# Patient Record
Sex: Male | Born: 1945 | Race: Black or African American | Hispanic: No | Smoking: Never smoker
Health system: Southern US, Community
[De-identification: ages and names within clinical notes are randomized; demographics above are authoritative.]

## PROBLEM LIST (undated history)

## (undated) DIAGNOSIS — I10 Essential (primary) hypertension: Secondary | ICD-10-CM

## (undated) DIAGNOSIS — D649 Anemia, unspecified: Secondary | ICD-10-CM

## (undated) HISTORY — PX: HEMORRHOID SURGERY: SHX153

---

## 2005-05-30 ENCOUNTER — Other Ambulatory Visit: Payer: Self-pay

## 2005-05-30 ENCOUNTER — Emergency Department: Payer: Self-pay | Admitting: Internal Medicine

## 2005-06-06 ENCOUNTER — Ambulatory Visit: Payer: Self-pay

## 2012-09-05 ENCOUNTER — Emergency Department: Payer: Self-pay | Admitting: Emergency Medicine

## 2016-09-24 ENCOUNTER — Encounter: Payer: Self-pay | Admitting: Emergency Medicine

## 2016-09-24 ENCOUNTER — Emergency Department
Admission: EM | Admit: 2016-09-24 | Discharge: 2016-09-24 | Disposition: A | Discharge status: Home / Self Care | Payer: Medicare Other | Attending: Emergency Medicine | Admitting: Emergency Medicine

## 2016-09-24 DIAGNOSIS — R42 Dizziness and giddiness: Secondary | ICD-10-CM | POA: Diagnosis present

## 2016-09-24 DIAGNOSIS — R030 Elevated blood-pressure reading, without diagnosis of hypertension: Secondary | ICD-10-CM | POA: Diagnosis not present

## 2016-09-24 DIAGNOSIS — Z79899 Other long term (current) drug therapy: Secondary | ICD-10-CM | POA: Insufficient documentation

## 2016-09-24 HISTORY — DX: Anemia, unspecified: D64.9

## 2016-09-24 LAB — BASIC METABOLIC PANEL
ANION GAP: 6 (ref 5–15)
BUN: 11 mg/dL (ref 6–20)
CHLORIDE: 104 mmol/L (ref 101–111)
CO2: 28 mmol/L (ref 22–32)
CREATININE: 1.03 mg/dL (ref 0.61–1.24)
Calcium: 9.3 mg/dL (ref 8.9–10.3)
GFR calc non Af Amer: >60 mL/min (ref >60)
Glucose, Bld: 113 mg/dL — ABNORMAL HIGH (ref 65–99)
POTASSIUM: 3.8 mmol/L (ref 3.5–5.1)
SODIUM: 138 mmol/L (ref 135–145)

## 2016-09-24 LAB — CBC
HEMATOCRIT: 39.7 % — AB (ref 40.0–52.0)
HEMOGLOBIN: 13 g/dL (ref 13.0–18.0)
MCH: 28.4 pg (ref 26.0–34.0)
MCHC: 32.8 g/dL (ref 32.0–36.0)
MCV: 86.6 fL (ref 80.0–100.0)
PLATELETS: 263 10*3/uL (ref 150–440)
RBC: 4.58 MIL/uL (ref 4.40–5.90)
RDW: 14.3 % (ref 11.5–14.5)
WBC: 4.3 10*3/uL (ref 3.8–10.6)

## 2016-09-24 MED ORDER — HYDROCHLOROTHIAZIDE 25 MG PO TABS: 25.0000 mg | ORAL_TABLET | Freq: Every day | ORAL | 1 refills | Status: DC

## 2016-09-24 NOTE — ED Provider Notes (Addendum)
[facility] Emergency Department Provider Note        Time seen: ----------------------------------------- 2:18 PM on 09/24/2016 -----------------------------------------    I have reviewed the triage vital signs and the nursing notes.   HISTORY  Chief Complaint Hypertension and Dizziness    HPI Earl HollowFred W Luth Jr. is a 70 y.o. male who presents to the ER for elevated blood pressure. Patient states he hasn't taken his blood pressure but he feels like he might be up. He reports he felt dizzy a few days ago but denies dizziness now. Patient denies taking medication for blood pressure. He is not sure when last time it was checked. He denies fevers, chills, chest pain, shortness of breath, vomiting or diarrhea.   Past Medical History:  Diagnosis Date  . Anemia     There are no active problems to display for this patient.   Past Surgical History:  Procedure Laterality Date  . HEMORRHOID SURGERY      Allergies Review of patient's allergies indicates no known allergies.  Social History Social History  Substance Use Topics  . Smoking status: Never Smoker  . Smokeless tobacco: Never Used  . Alcohol use No    Review of Systems Constitutional: Negative for fever. Cardiovascular: Negative for chest pain. Respiratory: Negative for shortness of breath. Gastrointestinal: Negative for abdominal pain, vomiting and diarrhea. Genitourinary: Negative for dysuria. Musculoskeletal: Negative for back pain. Skin: Negative for rash. Neurological: Negative for headaches, focal weakness or numbness.  10-point ROS otherwise negative.  ____________________________________________   PHYSICAL EXAM:  VITAL SIGNS: ED Triage Vitals [09/24/16 1105]  Enc Vitals Group     BP (!) 213/88     Pulse Rate 78     Resp 18     Temp (!) 96.9 F (36.1 C)     Temp Source Oral     SpO2 100 %     Weight 130 lb (59 kg)     Height 6\' 2"  (1.88 m)     Head Circumference       Peak Flow      Pain Score      Pain Loc      Pain Edu?      Excl. in GC?     Constitutional: Alert and oriented. Well appearing and in no distress. Eyes: Conjunctivae are normal. PERRL. Normal extraocular movements. ENT   Head: Normocephalic and atraumatic.   Nose: No congestion/rhinnorhea.   Mouth/Throat: Mucous membranes are moist.   Neck: No stridor. Cardiovascular: Normal rate, regular rhythm. No murmurs, rubs, or gallops. Respiratory: Normal respiratory effort without tachypnea nor retractions. Breath sounds are clear and equal bilaterally. No wheezes/rales/rhonchi. Gastrointestinal: Soft and nontender. Normal bowel sounds Musculoskeletal: Nontender with normal range of motion in all extremities. No lower extremity tenderness nor edema. Neurologic:  Normal speech and language. No gross focal neurologic deficits are appreciated.  Skin:  Skin is warm, dry and intact. No rash noted. Psychiatric: Mood and affect are normal. Speech and behavior are normal.  ____________________________________________  EKG: Interpreted by me. Normal sinus rhythm rate of 70 bpm, normal PR interval, normal QRS, normal QT.  ____________________________________________  ED COURSE:  Pertinent labs & imaging results that were available during my care of the patient were reviewed by me and considered in my medical decision making (see chart for details). Clinical Course  Patient is in no acute distress, we will assess with basic labs and reevaluate.  Procedures ____________________________________________   LABS (pertinent positives/negatives)  Labs Reviewed  BASIC  METABOLIC PANEL - Abnormal; Notable for the following:       Result Value   Glucose, Bld 113 (*)    All other components within normal limits  CBC - Abnormal; Notable for the following:    HCT 39.7 (*)    All other components within normal limits  URINALYSIS COMPLETEWITH MICROSCOPIC ([facility] ONLY)    ____________________________________________  FINAL ASSESSMENT AND PLAN  Elevated blood pressure  Plan: Patient with labs as dictated above. Patient presents the ER in no distress. His labs are grossly unremarkable, his blood pressures improved without any treatment. There may be an anxiety component. He is stable for outpatient follow-up with his doctor.   Emily FilbertWilliams, Ayonna Speranza E, MD   Note: This dictation was prepared with Dragon dictation. Any transcriptional errors that result from this process are unintentional    Emily FilbertJonathan E Chyann Ambrocio, MD 09/24/16 1538    Emily FilbertJonathan E Atreyu Mak, MD 09/24/16 [phone]

## 2016-09-24 NOTE — ED Triage Notes (Signed)
Pt presents to ED with reports of elevated blood pressure. Pt states he hasn't taken his BP but he feels like it might be up. Pt reports he felt dizzy a few days ago but denies dizziness now. Pt denies taking medication for blood pressure.

## 2016-09-24 NOTE — ED Notes (Signed)
Pt stating that he went to the "walk-in clinic" because he felt like he could pass out. Pt denying any palpitations, HA , SOB, or CP . The clinic sent him here for evaluation because his BP was elevated at this clinic. Pt's BP has decreased since arrival.

## 2016-09-24 NOTE — ED Notes (Signed)
Dr. Mayford KnifeWilliams made aware of pt's BP . Dr. Mayford KnifeWilliams has given pt BP medication and pt understands the need to follow up with his doctor.

## 2017-08-17 ENCOUNTER — Encounter: Payer: Self-pay | Admitting: Emergency Medicine

## 2017-08-17 ENCOUNTER — Observation Stay
Admission: EM | Admit: 2017-08-17 | Discharge: 2017-08-19 | Disposition: A | Discharge status: Home / Self Care | Payer: Medicare Other | Attending: Internal Medicine | Admitting: Internal Medicine

## 2017-08-17 ENCOUNTER — Emergency Department: Payer: Medicare Other

## 2017-08-17 DIAGNOSIS — R270 Ataxia, unspecified: Secondary | ICD-10-CM | POA: Diagnosis present

## 2017-08-17 DIAGNOSIS — Z7982 Long term (current) use of aspirin: Secondary | ICD-10-CM | POA: Insufficient documentation

## 2017-08-17 DIAGNOSIS — Z23 Encounter for immunization: Secondary | ICD-10-CM | POA: Insufficient documentation

## 2017-08-17 DIAGNOSIS — R42 Dizziness and giddiness: Secondary | ICD-10-CM

## 2017-08-17 DIAGNOSIS — E43 Unspecified severe protein-calorie malnutrition: Secondary | ICD-10-CM | POA: Diagnosis not present

## 2017-08-17 DIAGNOSIS — E860 Dehydration: Secondary | ICD-10-CM

## 2017-08-17 DIAGNOSIS — I340 Nonrheumatic mitral (valve) insufficiency: Secondary | ICD-10-CM | POA: Insufficient documentation

## 2017-08-17 DIAGNOSIS — I10 Essential (primary) hypertension: Secondary | ICD-10-CM | POA: Insufficient documentation

## 2017-08-17 DIAGNOSIS — E876 Hypokalemia: Secondary | ICD-10-CM | POA: Insufficient documentation

## 2017-08-17 DIAGNOSIS — D649 Anemia, unspecified: Secondary | ICD-10-CM | POA: Insufficient documentation

## 2017-08-17 DIAGNOSIS — Z79899 Other long term (current) drug therapy: Secondary | ICD-10-CM | POA: Diagnosis not present

## 2017-08-17 DIAGNOSIS — Z681 Body mass index (BMI) 19 or less, adult: Secondary | ICD-10-CM | POA: Diagnosis not present

## 2017-08-17 HISTORY — DX: Essential (primary) hypertension: I10

## 2017-08-17 LAB — BASIC METABOLIC PANEL
ANION GAP: 13 (ref 5–15)
BUN: 12 mg/dL (ref 6–20)
CALCIUM: 9.7 mg/dL (ref 8.9–10.3)
CO2: 25 mmol/L (ref 22–32)
Chloride: 100 mmol/L — ABNORMAL LOW (ref 101–111)
Creatinine, Ser: 1.27 mg/dL — ABNORMAL HIGH (ref 0.61–1.24)
GFR, EST NON AFRICAN AMERICAN: 55 mL/min — AB (ref >60)
GLUCOSE: 158 mg/dL — AB (ref 65–99)
POTASSIUM: 3 mmol/L — AB (ref 3.5–5.1)
Sodium: 138 mmol/L (ref 135–145)

## 2017-08-17 LAB — CBC
HEMATOCRIT: 34.4 % — AB (ref 40.0–52.0)
HEMOGLOBIN: 11.9 g/dL — AB (ref 13.0–18.0)
MCH: 29.2 pg (ref 26.0–34.0)
MCHC: 34.4 g/dL (ref 32.0–36.0)
MCV: 84.9 fL (ref 80.0–100.0)
Platelets: 304 10*3/uL (ref 150–440)
RBC: 4.06 MIL/uL — AB (ref 4.40–5.90)
RDW: 14.6 % — ABNORMAL HIGH (ref 11.5–14.5)
WBC: 7.2 10*3/uL (ref 3.8–10.6)

## 2017-08-17 LAB — URINALYSIS, COMPLETE (UACMP) WITH MICROSCOPIC
BACTERIA UA: NONE SEEN
BILIRUBIN URINE: NEGATIVE
Glucose, UA: NEGATIVE mg/dL
KETONES UR: 5 mg/dL — AB
LEUKOCYTES UA: NEGATIVE
NITRITE: NEGATIVE
Protein, ur: NEGATIVE mg/dL
SPECIFIC GRAVITY, URINE: 1.009 (ref 1.005–1.030)
Squamous Epithelial / LPF: NONE SEEN
pH: 7 (ref 5.0–8.0)

## 2017-08-17 LAB — TROPONIN I

## 2017-08-17 MED ORDER — MECLIZINE HCL 25 MG PO TABS
ORAL_TABLET | ORAL | Status: AC
  Filled 2017-08-17: qty 2

## 2017-08-17 MED ORDER — POTASSIUM CHLORIDE 20 MEQ PO PACK
40.0000 meq | PACK | Freq: Once | ORAL | Status: AC
  Administered 2017-08-17: 40 meq via ORAL
  Filled 2017-08-17: qty 2

## 2017-08-17 MED ORDER — ACETAMINOPHEN 325 MG PO TABS: 650.0000 mg | ORAL_TABLET | ORAL | Status: DC | PRN

## 2017-08-17 MED ORDER — ACETAMINOPHEN 650 MG RE SUPP: 650.0000 mg | RECTAL | Status: DC | PRN

## 2017-08-17 MED ORDER — LISINOPRIL 10 MG PO TABS: 10.0000 mg | ORAL_TABLET | Freq: Every day | ORAL | 0 refills | Status: DC

## 2017-08-17 MED ORDER — AMLODIPINE BESYLATE 5 MG PO TABS: 2.5000 mg | ORAL_TABLET | Freq: Every day | ORAL | 0 refills | Status: DC

## 2017-08-17 MED ORDER — MECLIZINE HCL 25 MG PO TABS
50.0000 mg | ORAL_TABLET | Freq: Once | ORAL | Status: AC
  Administered 2017-08-17: 50 mg via ORAL

## 2017-08-17 MED ORDER — ASPIRIN 81 MG PO CHEW
324.0000 mg | CHEWABLE_TABLET | Freq: Once | ORAL | Status: AC
  Administered 2017-08-17: 324 mg via ORAL
  Filled 2017-08-17: qty 4

## 2017-08-17 MED ORDER — SODIUM CHLORIDE 0.9 % IV BOLUS (SEPSIS)
1000.0000 mL | Freq: Once | INTRAVENOUS | Status: AC
  Administered 2017-08-17: 1000 mL via INTRAVENOUS

## 2017-08-17 MED ORDER — PNEUMOCOCCAL VAC POLYVALENT 25 MCG/0.5ML IJ INJ
0.5000 mL | INJECTION | INTRAMUSCULAR | Status: AC
  Administered 2017-08-18: 17:00:00 0.5 mL via INTRAMUSCULAR
  Filled 2017-08-17: qty 0.5

## 2017-08-17 MED ORDER — CEFTRIAXONE SODIUM IN DEXTROSE 20 MG/ML IV SOLN: 1.0000 g | Freq: Once | INTRAVENOUS | Status: DC

## 2017-08-17 MED ORDER — ENOXAPARIN SODIUM 40 MG/0.4ML ~~LOC~~ SOLN
40.0000 mg | SUBCUTANEOUS | Status: DC
  Administered 2017-08-17 – 2017-08-18 (×2): 40 mg via SUBCUTANEOUS
  Filled 2017-08-17 (×2): qty 0.4

## 2017-08-17 MED ORDER — MECLIZINE HCL 25 MG PO TABS: 25.0000 mg | ORAL_TABLET | Freq: Three times a day (TID) | ORAL | 1 refills | Status: DC | PRN

## 2017-08-17 MED ORDER — ONDANSETRON HCL 4 MG/2ML IJ SOLN
4.0000 mg | Freq: Once | INTRAMUSCULAR | Status: AC
  Administered 2017-08-17: 4 mg via INTRAVENOUS
  Filled 2017-08-17: qty 2

## 2017-08-17 MED ORDER — STROKE: EARLY STAGES OF RECOVERY BOOK
Freq: Once | Status: AC
  Administered 2017-08-17: 22:00:00

## 2017-08-17 MED ORDER — AZITHROMYCIN 500 MG PO TABS: 500.0000 mg | ORAL_TABLET | Freq: Once | ORAL | Status: DC

## 2017-08-17 MED ORDER — ACETAMINOPHEN 160 MG/5ML PO SOLN
650.0000 mg | ORAL | Status: DC | PRN
  Filled 2017-08-17: qty 20.3

## 2017-08-17 NOTE — ED Provider Notes (Addendum)
[facility] Emergency Department Provider Note  ____________________________________________  Time seen: Approximately 4:04 PM  I have reviewed the triage vital signs and the nursing notes.   HISTORY  Chief Complaint Dizziness    HPI Earl Jenkins. is a 71 y.o. male comes to the ED complaining of dizziness that started this morning. He characterizes the dizziness as room spinning and not lightheadedness. It is worse when he opens his eyes or turns his head. It's worse when he changes position. No alleviating factors. He also feels like his hearing is muffled in his right ear.symptoms are intermittent, moderate intensity and present. No falls or trauma  He has a history of hypertension, and had been off medication for a period of time. He was just restarted on antihypertensives 4 days ago. He was started on a combination lisinopril hydrochlorothiazide medication.he notes that while he's been taking these medications, he's been urinating very frequently and feeling more thirsty. No vomiting or diarrhea. No chest pain abdominal pain back pain shortness of breath. No headaches or paresthesias or weakness. Denies dysuria     Past Medical History:  Diagnosis Date  . Anemia   . Hypertension      There are no active problems to display for this patient.    Past Surgical History:  Procedure Laterality Date  . HEMORRHOID SURGERY       Prior to Admission medications   Medication Sig Start Date End Date Taking? Authorizing Provider  vitamin B-12 (CYANOCOBALAMIN) 1000 MCG tablet Take 1,000 mcg by mouth daily.   Yes [provider]  amLODipine (NORVASC) 5 MG tablet Take 0.5 tablets (2.5 mg total) by mouth daily. 08/17/17   Sharman Cheek, MD  lisinopril (PRINIVIL,ZESTRIL) 10 MG tablet Take 1 tablet (10 mg total) by mouth daily. 08/17/17 08/17/18  Sharman Cheek, MD  meclizine (ANTIVERT) 25 MG tablet Take 1 tablet (25 mg total) by mouth 3 (three)  times daily as needed for dizziness or nausea. 08/17/17   Sharman Cheek, MD     Allergies Patient has no known allergies.   No family history on file.  Social History Social History  Substance Use Topics  . Smoking status: Never Smoker  . Smokeless tobacco: Never Used  . Alcohol use No    Review of Systems  Constitutional:   No fever or chills.  ENT:   No sore throat. No rhinorrhea. Cardiovascular:   No chest pain or syncope. Respiratory:   No dyspnea or cough. Gastrointestinal:   Negative for abdominal pain, vomiting and diarrhea.  Musculoskeletal:   Negative for focal pain or swelling All other systems reviewed and are negative except as documented above in ROS and HPI.  ____________________________________________   PHYSICAL EXAM:  VITAL SIGNS: ED Triage Vitals  Enc Vitals Group     BP 08/17/17 1300 (!) 143/72     Pulse Rate 08/17/17 1300 (!) 59     Resp 08/17/17 1300 16     Temp 08/17/17 1300 97.8 F (36.6 C)     Temp Source 08/17/17 1300 Oral     SpO2 08/17/17 1300 100 %     Weight 08/17/17 1258 130 lb (59 kg)     Height 08/17/17 1258  (1.88 m)     Head Circumference --      Peak Flow --      Pain Score --      Pain Loc --      Pain Edu? --      Excl.  in GC? --     Vital signs reviewed, nursing assessments reviewed.   Constitutional:   Alert and oriented. Well appearing and in no distress. Eyes:   No scleral icterus.  EOMI. No nystagmus. No conjunctival pallor. PERRL. ENT   Head:   Normocephalic and atraumatic.external canal and TMs normal bilaterally   Nose:   No congestion/rhinnorhea.    Mouth/Throat:   MMM, no pharyngeal erythema. No peritonsillar mass.    Neck:   No meningismus. Full ROM Hematological/Lymphatic/Immunilogical:   No cervical lymphadenopathy. Cardiovascular:   RRR. Symmetric bilateral radial and DP pulses.  No murmurs.  Respiratory:   Normal respiratory effort without tachypnea/retractions. Breath sounds are  clear and equal bilaterally. No wheezes/rales/rhonchi. Gastrointestinal:   Soft and nontender. Non distended. There is no CVA tenderness.  No rebound, rigidity, or guarding. Genitourinary:   deferred Musculoskeletal:   Normal range of motion in all extremities. No joint effusions.  No lower extremity tenderness.  No edema. Neurologic:   Normal speech and language.  Motor grossly intact. cerebellar function intact No gross focal neurologic deficits are appreciated.  Skin:    Skin is warm, dry and intact. No rash noted.  No petechiae, purpura, or bullae.  ____________________________________________    LABS (pertinent positives/negatives) (all labs ordered are listed, but only abnormal results are displayed) Labs Reviewed  BASIC METABOLIC PANEL - Abnormal; Notable for the following:       Result Value   Potassium 3.0 (*)    Chloride 100 (*)    Glucose, Bld 158 (*)    Creatinine, Ser 1.27 (*)    GFR calc non Af Amer 55 (*)    All other components within normal limits  CBC - Abnormal; Notable for the following:    RBC 4.06 (*)    Hemoglobin 11.9 (*)    HCT 34.4 (*)    RDW 14.6 (*)    All other components within normal limits  URINALYSIS, COMPLETE (UACMP) WITH MICROSCOPIC - Abnormal; Notable for the following:    Color, Urine STRAW (*)    APPearance CLEAR (*)    Hgb urine dipstick SMALL (*)    Ketones, ur 5 (*)    All other components within normal limits  TROPONIN I  CBG MONITORING, ED   ____________________________________________   EKG  interpreted by me Sinus rhythm rate of 58, normal axis and intervals. Normal QRS ST segments and T waves. Extensive artifact and baseline wander limits interpretation, but overall it is a normal-appearing EKG.  ____________________________________________    RADIOLOGY  Ct Head Wo Contrast  Result Date: 08/17/2017 CLINICAL DATA:  Dizziness EXAM: CT HEAD WITHOUT CONTRAST TECHNIQUE: Contiguous axial images were obtained from the base  of the skull through the vertex without intravenous contrast. COMPARISON:  None. FINDINGS: Brain: No evidence of acute infarction, hemorrhage, hydrocephalus, extra-axial collection or mass lesion/mass effect. Vascular: No hyperdense vessel or unexpected calcification. Skull: Normal. Negative for fracture or focal lesion. Sinuses/Orbits: No acute finding. Other: None. IMPRESSION: No acute abnormality noted. Electronically Signed   By: Alcide Clever M.D.   On: 08/17/2017 14:53    ____________________________________________   PROCEDURES Procedures  ____________________________________________   INITIAL IMPRESSION / ASSESSMENT AND PLAN / ED COURSE  Pertinent labs & imaging results that were available during my care of the patient were reviewed by me and considered in my medical decision making (see chart for details).    Clinical Course as of Aug 17 2030  Wynelle Link Aug 17, 2017  1526 P/w vertigo, likely  precipitated by dehydration and hypokalemia secondary to initiation of HCTZ.  Will give IVF, K repletion, after which I expect patient to feel much better.  Pt advised to DC his combo lisinopril/hctz pill.  I will rx lisinopril and amlodipine in equivalent dosing.  [PS]  1742 Still dizzy/ataxic on standing. Will give repeat NS bolus , meclizine, reassess  [PS]  1943 Pt now feels back to normal, "like my own self." ambulatory with steady gait. No pronator drift. Normal finger-nose. Vitals unremarkable. Will DC home. Presentation not c/w acs, pe, dissection, stroke.  [PS]  2029 Now dizzy and ataxic again. Possible vertebrobasilar insufficiency or posterior stroke. We'll plan to hospitalize for further stroke workup. Currently, NIH stroke scale is 1, patient is not a candidate for acute intervention such as endovascular or TPA.  [PS]    Clinical Course User Index [PS] Sharman Cheek, MD     ____________________________________________   FINAL CLINICAL IMPRESSION(S) / ED DIAGNOSES  Final  diagnoses:  Vertigo  Dehydration  Ataxia      New Prescriptions   AMLODIPINE (NORVASC) 5 MG TABLET    Take 0.5 tablets (2.5 mg total) by mouth daily.   LISINOPRIL (PRINIVIL,ZESTRIL) 10 MG TABLET    Take 1 tablet (10 mg total) by mouth daily.   MECLIZINE (ANTIVERT) 25 MG TABLET    Take 1 tablet (25 mg total) by mouth 3 (three) times daily as needed for dizziness or nausea.     Portions of this note were generated with dragon dictation software. Dictation errors may occur despite best attempts at proofreading.    Sharman Cheek, MD 08/17/17 1946    Sharman Cheek, MD 08/17/17 2033

## 2017-08-17 NOTE — ED Notes (Signed)
Ambulated patient in room.  Patient c/o felling dizzy when standing and ambulating.  Gait slightly unsteady.

## 2017-08-17 NOTE — ED Notes (Signed)
Dr Scotty Court at bedside to address pt concerns at this time.

## 2017-08-17 NOTE — ED Notes (Signed)
Pt called out. Informed this RN that he wanted the MD to be aware that he "was feeling dizzy again". Dr Scotty Court to be made aware.

## 2017-08-17 NOTE — ED Notes (Signed)
Patient denies pain and is resting comfortably.  

## 2017-08-17 NOTE — ED Triage Notes (Signed)
Arrives via [facility] with c/o dizziness.  Symptoms started this morning.  C/O dizziness when opening eyes, moving head, standing up.  States just restarted on BP pills this week on Wednesday.

## 2017-08-17 NOTE — H&P (Signed)
[facility] [place] [facility]   PATIENT NAME: Earl Jenkins    MR#:  [id number]  DATE OF BIRTH:  [birth year]  DATE OF ADMISSION:  08/17/2017  PRIMARY CARE PHYSICIAN: Dorothey Baseman, MD   REQUESTING/REFERRING PHYSICIAN: Scotty Court, MD  CHIEF COMPLAINT:   Chief Complaint  Patient presents with  . Dizziness    HISTORY OF PRESENT ILLNESS:  Earl Jenkins  is a 71 y.o. male who presents with acute onset room spinning sensation and ataxia starting suddenly around 9 AM this morning. When his symptoms persisted he came to the ED for evaluation. His initial workup here was within normal limits. He recently started taking HCTZ for BP control, and initially it was felt this could be contributing to his symptoms. After fluid administration he initially felt some better, but when he got up to walk symptoms returned. Hospitalists were called for admission.  PAST MEDICAL HISTORY:   Past Medical History:  Diagnosis Date  . Anemia   . Hypertension     PAST SURGICAL HISTORY:   Past Surgical History:  Procedure Laterality Date  . HEMORRHOID SURGERY      SOCIAL HISTORY:   Social History  Substance Use Topics  . Smoking status: Never Smoker  . Smokeless tobacco: Never Used  . Alcohol use No    FAMILY HISTORY:   Family History  Problem Relation Age of Onset  . Hypertension Other     DRUG ALLERGIES:  No Known Allergies  MEDICATIONS AT HOME:   Prior to Admission medications   Medication Sig Start Date End Date Taking? Authorizing Provider  vitamin B-12 (CYANOCOBALAMIN) 1000 MCG tablet Take 1,000 mcg by mouth daily.   Yes [provider]  amLODipine (NORVASC) 5 MG tablet Take 0.5 tablets (2.5 mg total) by mouth daily. 08/17/17   Sharman Cheek, MD  lisinopril (PRINIVIL,ZESTRIL) 10 MG tablet Take 1 tablet (10 mg total) by mouth daily. 08/17/17 08/17/18  Sharman Cheek, MD  meclizine (ANTIVERT) 25 MG tablet Take 1 tablet (25 mg total) by  mouth 3 (three) times daily as needed for dizziness or nausea. 08/17/17   Sharman Cheek, MD    REVIEW OF SYSTEMS:  Review of Systems  Constitutional: Negative for chills, fever, malaise/fatigue and weight loss.  HENT: Negative for ear pain, hearing loss and tinnitus.   Eyes: Negative for blurred vision, double vision, pain and redness.  Respiratory: Negative for cough, hemoptysis and shortness of breath.   Cardiovascular: Negative for chest pain, palpitations, orthopnea and leg swelling.  Gastrointestinal: Negative for abdominal pain, constipation, diarrhea, nausea and vomiting.  Genitourinary: Negative for dysuria, frequency and hematuria.  Musculoskeletal: Negative for back pain, joint pain and neck pain.  Skin:       No acne, rash, or lesions  Neurological: Positive for dizziness. Negative for tremors, focal weakness and weakness.  Endo/Heme/Allergies: Negative for polydipsia. Does not bruise/bleed easily.  Psychiatric/Behavioral: Negative for depression. The patient is not nervous/anxious and does not have insomnia.      VITAL SIGNS:   Vitals:   08/17/17 1258 08/17/17 1300 08/17/17 1913 08/17/17 2038  BP:  (!) 143/72 (!) 153/77 (!) 159/72  Pulse:  (!) 59 68 67  Resp:  Temp:  97.8 F (36.6 C)    TempSrc:  Oral    SpO2:  100% 100% 100%  Weight: 59 kg (130 lb)     Height:  (1.88 m)      Wt Readings from Last 3 Encounters:  08/17/17 59 kg (130 lb)  09/24/16 59 kg (130 lb)    PHYSICAL EXAMINATION:  Physical Exam  Vitals reviewed. Constitutional: He is oriented to person, place, and time. He appears well-developed and well-nourished. No distress.  HENT:  Head: Normocephalic and atraumatic.  Mouth/Throat: Oropharynx is clear and moist.  Eyes: Pupils are equal, round, and reactive to light. Conjunctivae and EOM are normal. No scleral icterus.  Neck: Normal range of motion. Neck supple. No JVD present. No thyromegaly present.  Cardiovascular: Normal rate,  regular rhythm and intact distal pulses.  Exam reveals no gallop and no friction rub.   No murmur heard. Respiratory: Effort normal and breath sounds normal. No respiratory distress. He has no wheezes. He has no rales.  GI: Soft. Bowel sounds are normal. He exhibits no distension. There is no tenderness.  Musculoskeletal: Normal range of motion. He exhibits no edema.  No arthritis, no gout  Lymphadenopathy:    He has no cervical adenopathy.  Neurological: He is alert and oriented to person, place, and time. No cranial nerve deficit.  Neurologic: Cranial nerves II-XII intact, Sensation intact to light touch/pinprick, 5/5 strength in all extremities, no dysarthria, no aphasia, no dysphagia, memory intact, finger to nose testing showed mild pastpointing L>R hand and mild double tapping on rapid alternating movements L>R, no pronator drift   Skin: Skin is warm and dry. No rash noted. No erythema.  Psychiatric: He has a normal mood and affect. His behavior is normal. Judgment and thought content normal.    LABORATORY PANEL:   CBC  Recent Labs Lab 08/17/17 1316  WBC 7.2  HGB 11.9*  HCT 34.4*  PLT 304   ------------------------------------------------------------------------------------------------------------------  Chemistries   Recent Labs Lab 08/17/17 1316  NA 138  K 3.0*  CL 100*  CO2 25  GLUCOSE 158*  BUN 12  CREATININE 1.27*  CALCIUM 9.7   ------------------------------------------------------------------------------------------------------------------  Cardiac Enzymes  Recent Labs Lab 08/17/17 1316  TROPONINI <0.03   ------------------------------------------------------------------------------------------------------------------  RADIOLOGY:  Ct Head Wo Contrast  Result Date: 08/17/2017 CLINICAL DATA:  Dizziness EXAM: CT HEAD WITHOUT CONTRAST TECHNIQUE: Contiguous axial images were obtained from the base of the skull through the vertex without intravenous  contrast. COMPARISON:  None. FINDINGS: Brain: No evidence of acute infarction, hemorrhage, hydrocephalus, extra-axial collection or mass lesion/mass effect. Vascular: No hyperdense vessel or unexpected calcification. Skull: Normal. Negative for fracture or focal lesion. Sinuses/Orbits: No acute finding. Other: None. IMPRESSION: No acute abnormality noted. Electronically Signed   By: Alcide Clever M.D.   On: 08/17/2017 14:53    EKG:   Orders placed or performed during the hospital encounter of 08/17/17  . ED EKG  . ED EKG  . EKG 12-Lead  . EKG 12-Lead    IMPRESSION AND PLAN:  Principal Problem:   Ataxia - admitted with orders per stroke admission orders set, including corresponding labs, imaging, and consults including neurology consult Active Problems:   HTN (hypertension) - holding home antihypertensives right now, a) until stroke workup is complete, and b) given suspicion that if he didn't have a stroke his symptoms may be a side effect from his new antihypertensives.   Dizziness - workup as above, when necessary meclizine  All the records are reviewed and case discussed with ED provider. Management plans discussed with the patient and/or family.  DVT PROPHYLAXIS: SubQ lovenox  GI PROPHYLAXIS: None  ADMISSION STATUS: Observation  CODE STATUS: Full Code Status History    This patient does not have a recorded code status.  Please follow your organizational policy for patients in this situation.      TOTAL TIME TAKING CARE OF THIS PATIENT: 40 minutes.   Earl Jenkins 08/17/2017, 8:44 PM  [facility]  Office  [phone]  CC: Primary care physician; Dorothey Baseman, MD  Note:  This document was prepared using Dragon voice recognition software and may include unintentional dictation errors.

## 2017-08-18 ENCOUNTER — Observation Stay (HOSPITAL_BASED_OUTPATIENT_CLINIC_OR_DEPARTMENT_OTHER)
Admit: 2017-08-18 | Discharge: 2017-08-18 | Disposition: A | Discharge status: Home / Self Care | Payer: Medicare Other | Attending: Internal Medicine | Admitting: Internal Medicine

## 2017-08-18 ENCOUNTER — Observation Stay: Payer: Medicare Other

## 2017-08-18 DIAGNOSIS — I340 Nonrheumatic mitral (valve) insufficiency: Secondary | ICD-10-CM

## 2017-08-18 DIAGNOSIS — R42 Dizziness and giddiness: Secondary | ICD-10-CM

## 2017-08-18 DIAGNOSIS — R270 Ataxia, unspecified: Secondary | ICD-10-CM

## 2017-08-18 LAB — LIPID PANEL
CHOLESTEROL: 173 mg/dL (ref 0–200)
HDL: 77 mg/dL (ref >40)
LDL CALC: 87 mg/dL (ref 0–99)
TRIGLYCERIDES: 43 mg/dL (ref <150)
Total CHOL/HDL Ratio: 2.2 RATIO
VLDL: 9 mg/dL (ref 0–40)

## 2017-08-18 LAB — HEMOGLOBIN A1C
HEMOGLOBIN A1C: 5.3 % (ref 4.8–5.6)
Mean Plasma Glucose: 105.41 mg/dL

## 2017-08-18 LAB — POTASSIUM: Potassium: 3.8 mmol/L (ref 3.5–5.1)

## 2017-08-18 LAB — ECHOCARDIOGRAM COMPLETE
Height: 74 in
Weight: 2059.2 oz

## 2017-08-18 LAB — VITAMIN B12: Vitamin B-12: 2298 pg/mL — ABNORMAL HIGH (ref 180–914)

## 2017-08-18 MED ORDER — ADULT MULTIVITAMIN W/MINERALS CH
1.0000 | ORAL_TABLET | Freq: Every day | ORAL | Status: DC
  Administered 2017-08-19: 1 via ORAL
  Filled 2017-08-18: qty 1

## 2017-08-18 MED ORDER — ENSURE ENLIVE PO LIQD
237.0000 mL | Freq: Three times a day (TID) | ORAL | Status: DC
  Administered 2017-08-18 – 2017-08-19 (×2): 237 mL via ORAL

## 2017-08-18 MED ORDER — BENAZEPRIL HCL 5 MG PO TABS
5.0000 mg | ORAL_TABLET | Freq: Every day | ORAL | Status: DC
  Filled 2017-08-18: qty 1

## 2017-08-18 MED ORDER — GADOBENATE DIMEGLUMINE 529 MG/ML IV SOLN
10.0000 mL | Freq: Once | INTRAVENOUS | Status: AC | PRN
  Administered 2017-08-18: 10 mL via INTRAVENOUS

## 2017-08-18 MED ORDER — VITAMIN B-12 1000 MCG PO TABS
1000.0000 ug | ORAL_TABLET | Freq: Every day | ORAL | Status: DC
  Administered 2017-08-18 – 2017-08-19 (×2): 1000 ug via ORAL
  Filled 2017-08-18 (×2): qty 1

## 2017-08-18 MED ORDER — BENAZEPRIL HCL 5 MG PO TABS
5.0000 mg | ORAL_TABLET | Freq: Every day | ORAL | Status: DC
  Administered 2017-08-19: 5 mg via ORAL
  Filled 2017-08-18: qty 1

## 2017-08-18 NOTE — Care Management Obs Status (Signed)
MEDICARE OBSERVATION STATUS NOTIFICATION   Patient Details  Name: Earl Jenkins. MRN: [id number] Date of Birth: 1946/08/15   Medicare Observation Status Notification Given:  Yes Notice given and explained to patient and wife.  The patient and wife declined to sign.   One given to patient and the other to HIM for scanning   Eber Hong, RN 08/18/2017, 11:24 AM

## 2017-08-18 NOTE — Progress Notes (Signed)
Physical Therapy Evaluation Patient Details Name: Earl Jenkins. MRN: [id number] DOB: August 02, 1946 Today's Date: 08/18/2017   History of Present Illness  Pt admitted for ataxia due to c/o dizziness. PMH of HTN.   Clinical Impression  Pt is a pleasant 71 year old male who was admitted for ataxia. Pt performs bed mobility independently, transfers with RW with mod. I, and amb with RW with CGA. Pt reports feeling dizzy when rolling to R side and sitting up, however dizziness subsides after a few minutes. Pt amb one lap around nurse's station with RW and CGA, would occasionally lean arm against mine for stability during turns. Pt amb a second lap around nurse's station without RW and appeared able to maintain balance throughout with CGA. Pt appeared safe to amb without RW, however is slightly unsteady on feet when making turns, recommend use of RW at home. During amb pt able to visually fixate on objects in hallway, no noted dizzness or LOB. Monitored BP throughout, no orthostasis. Performed vestibular tests, pt demonstrates deficits with balance during amb and vestibular-ocular function, noted R eye nystagmus at baseline and direction-changing nystagmus. Would benefit from skilled PT to address above deficits and promote optimal return to home. Recommend transition to outpatient PT with vestibular focus upon DC from acute hospitalization. This entire session was guided, instructed, and directly supervised by Cephus Slater, DPT.    Follow Up Recommendations Outpatient PT    Equipment Recommendations  Rolling walker with 5" wheels    Recommendations for Other Services       Precautions / Restrictions Precautions Precautions: Fall Restrictions Weight Bearing Restrictions: No      Mobility  Bed Mobility Overal bed mobility: Independent Bed Mobility: Supine to Sit     Supine to sit: Independent     General bed mobility comments: Pt able to rise from supine to sit at EOB with proper  technique, no cues, no use of bed rails. Pt reported feeling slightly dizzy, subsided after a few min. Pt able to roll to both sides, reported increased dizziness when rolling to R.   Transfers Overall transfer level: Modified independent Equipment used: Rolling walker (2 wheeled)             General transfer comment: Pt able to rise from seated EOB to standing with proper technique, no cues needed. Pt reported feeling dizzy upon standing, but felt less dizzy after a few min.   Ambulation/Gait Ambulation/Gait assistance: Min guard Ambulation Distance (Feet): 390 Feet Assistive device: Rolling walker (2 wheeled) Gait Pattern/deviations: Step-through pattern     General Gait Details: Pt amb with reciprocal alternating gait pattern. Pt able to look at objects in hallway and room numbers while amb to increase challenge. Pt able to maintain balance during straight paths, however with turns appeared slightly unsteady on feet. Pt amb one lap with RW, and one lap without RW, no LOB/SOB/dizziness.     Stairs            Wheelchair Mobility    Modified Rankin (Stroke Patients Only)       Balance Overall balance assessment: Needs assistance Sitting-balance support: Feet supported Sitting balance-Leahy Scale: Good Sitting balance - Comments: Pt able to safely sit at EOB independenlty, able to maintain position while measuring BP   Standing balance support: Bilateral upper extremity supported Standing balance-Leahy Scale: Good Standing balance comment: Pt able to stand at EOB with RW and look side to side several times. Pt did not appear to need RW however  used it for safety since pt reported feeling dizzy upon standing. Maintained position with no LOB while measuring BP.                              Pertinent Vitals/Pain Pain Assessment: No/denies pain    Home Living Family/patient expects to be discharged to:: Private residence Living Arrangements:  Non-relatives/Friends Available Help at Discharge: Friend(s) Type of Home: House Home Access: Stairs to enter Entrance Stairs-Rails: Can reach both;Right;Left Entrance Stairs-Number of Steps: 3 in front entrance, 2 in rear entrance Home Layout: One level Home Equipment: None      Prior Function Level of Independence: Independent         Comments: Pt able to perform all ADLs independently, mow the lawn, ride his bicycle     Hand Dominance        Extremity/Trunk Assessment   Upper Extremity Assessment Upper Extremity Assessment: Generalized weakness (UE MMT grossly 4/5)    Lower Extremity Assessment Lower Extremity Assessment: Generalized weakness (LE MMT grossly 4/5)    Cervical / Trunk Assessment Cervical / Trunk Assessment: Normal  Communication   Communication: No difficulties  Cognition Arousal/Alertness: Awake/alert Behavior During Therapy: WFL for tasks assessed/performed Overall Cognitive Status: Within Functional Limits for tasks assessed                                        General Comments      Exercises Other Exercises Other Exercises: Supine ther-ex 10x, B ankle pumps, B SLRs. Pt able to perform with proper technique, no cues needed.  Other Exercises: Supination/pronation RAMPs 5x and heel-to-knee RAMPS 5x both sides, pt able to maintain quick tempo and coordination. Finger-to-nose, pt able to maintain tempo, increased challenge by changing location of finger, pt able to perform with one second delay with new finger location. Other Exercises: RAMPs heel-to-knee 5x, able to maintain tempo Other Exercises: Vestibular: Pt presents with slight nystagmus of R eye at baseline, positive for direction-changing nystagmus and upbeating nystagmus. Able to perform visual tracking, saccadic eye movements, VOR relfex intact.    Assessment/Plan    PT Assessment Patient needs continued PT services  PT Problem List Decreased activity  tolerance;Decreased mobility;Decreased coordination;Decreased knowledge of use of DME       PT Treatment Interventions DME instruction;Gait training;Therapeutic activities;Therapeutic exercise;Balance training;Other (comment) (vestibular treatment)    PT Goals (Current goals can be found in the Care Plan section)  Acute Rehab PT Goals Patient Stated Goal: To return home and decrease dizziness PT Goal Formulation: With patient Time For Goal Achievement: 09/01/17 Potential to Achieve Goals: Good    Frequency Min 2X/week   Barriers to discharge        Co-evaluation               AM-PAC PT "6 Clicks" Daily Activity  Outcome Measure Difficulty turning over in bed (including adjusting bedclothes, sheets and blankets)?: None Difficulty moving from lying on back to sitting on the side of the bed? : None Difficulty sitting down on and standing up from a chair with arms (e.g., wheelchair, bedside commode, etc,.)?: None Help needed moving to and from a bed to chair (including a wheelchair)?: None Help needed walking in hospital room?: None Help needed climbing 3-5 steps with a railing? : A Little 6 Click Score: 23    End of Session  Equipment Utilized During Treatment: Gait belt Activity Tolerance: Patient tolerated treatment well Patient left: in chair;with call bell/phone within reach;with chair alarm set;with family/visitor present   PT Visit Diagnosis: Other symptoms and signs involving the nervous system (R29.898);Unsteadiness on feet (R26.81);Other abnormalities of gait and mobility (R26.89)    Time: 2956-2130 PT Time Calculation (min) (ACUTE ONLY): 42 min   Charges:         PT G Codes:   PT G-Codes **NOT FOR INPATIENT CLASS** Functional Assessment Tool Used: AM-PAC 6 Clicks Basic Mobility Functional Limitation: Mobility: Walking and moving around Mobility: Walking and Moving Around Current Status (Q6578): At least 1 percent but less than 20 percent impaired, limited  or restricted Mobility: Walking and Moving Around Goal Status [phone]): 0 percent impaired, limited or restricted    Renford Dills, SPT  Renford Dills 08/18/2017, 11:55 AM

## 2017-08-18 NOTE — Progress Notes (Signed)
SLP Cancellation Note  Patient Details Name: Earl Jenkins. MRN: [id number] DOB: 06/08/1946   Cancelled treatment:       Reason Eval/Treat Not Completed: SLP screened, no needs identified, will sign off (chart reviewed; consulted NSG then met w/ pt/family). Pt denied any difficulty swallowing and is currently on a regular diet; tolerates swallowing pills w/ water per NSG. He does not have his dentures w/ him and is choosing softer, broken down foods w/ family and Dietitian currently. Pt conversed at conversational level w/out deficits noted; pt and family denied any speech-language deficits.  No further skilled ST services indicated as pt appears at his baseline. Pt agreed. NSG to reconsult if any change in status.    Orinda Kenner, MS, CCC-SLP Rainelle Sulewski 08/18/2017, 11:36 AM

## 2017-08-18 NOTE — Progress Notes (Signed)
*  PRELIMINARY RESULTS* Echocardiogram 2D Echocardiogram has been performed.  Earl Jenkins 08/18/2017, 1:31 PM

## 2017-08-18 NOTE — Care Management (Signed)
Placed in observation for dizziness and ataxia.Independent in all adls, denies issues accessing medical care, obtaining medications or with transportation.  Current with PCP.  Has access to a walker if needed. Work up for cva thus far is negative

## 2017-08-18 NOTE — Care Management (Signed)
Presents from home with dizziness and ataxia and work up in progress to rule out CVA.  PT and neuro consults pending.  obtained order for set of orthostatic vital signs

## 2017-08-18 NOTE — Progress Notes (Signed)
Patient in bed, not in any form of distress. Verbalized a decreased in being dizzy. NIH remains zero, neuro check every 2 hours done and will continue till 10:00. Kept safe and comfortable. Needs attended.

## 2017-08-18 NOTE — Progress Notes (Addendum)
[facility] - [place] at [facility]                                                                                                                                                                                  Patient Demographics   Earl Jenkins, is a 71 y.o. male, DOB - 1946-04-03, YNW:[id number]  Admit date - 08/17/2017   Admitting Physician [name], MD  Outpatient Primary MD for the patient is Dorothey Baseman, MD   LOS - 0  Subjective: Pt  admitted with dizziness and ataxia    Review of Systems:   CONSTITUTIONAL: No documented fever. No fatigue, weakness. No weight gain, no weight loss.  EYES: No blurry or double vision.  ENT: No tinnitus. No postnasal drip. No redness of the oropharynx.  RESPIRATORY: No cough, no wheeze, no hemoptysis. No dyspnea.  CARDIOVASCULAR: No chest pain. No orthopnea. No palpitations. No syncope.  GASTROINTESTINAL: No nausea, no vomiting or diarrhea. No abdominal pain. No melena or hematochezia.  GENITOURINARY: No dysuria or hematuria.  ENDOCRINE: No polyuria or nocturia. No heat or cold intolerance.  HEMATOLOGY: No anemia. No bruising. No bleeding.  INTEGUMENTARY: No rashes. No lesions.  MUSCULOSKELETAL: No arthritis. No swelling. No gout.  NEUROLOGIC: ataxia PSYCHIATRIC: No anxiety. No insomnia. No ADD.    Vitals:   Vitals:   08/18/17 0632 08/18/17 0806 08/18/17 0836 08/18/17 1034  BP: 134/71 (!) 144/66 (!) 157/69 (!) 160/70  Pulse: 62 (!) 59 70 78  Resp: Temp: 98.9 F (37.2 C) 98.3 F (36.8 C) 98.7 F (37.1 C) 98.6 F (37 C)  TempSrc: Oral Oral Oral Oral  SpO2: 100% 100% 100% 100%  Weight:      Height:        Wt Readings from Last 3 Encounters:  08/17/17 128 lb 11.2 oz (58.4 kg)  09/24/16 130 lb (59 kg)     Intake/Output Summary (Last 24 hours) at 08/18/17 1355 Last data filed at 08/18/17 0900  Gross per 24 hour  Intake             2360 ml  Output              325 ml  Net              2035 ml    Physical Exam:   GENERAL: Pleasant-appearing in no apparent distress.  HEAD, EYES, EARS, NOSE AND THROAT: Atraumatic, normocephalic. Extraocular muscles are intact. Pupils equal and reactive to light. Sclerae anicteric. No conjunctival injection. No oro-pharyngeal erythema.  NECK: Supple. There is no jugular venous distention. No bruits, no lymphadenopathy, no thyromegaly.  HEART: Regular rate and rhythm,.  No murmurs, no rubs, no clicks.  LUNGS: Clear to auscultation bilaterally. No rales or rhonchi. No wheezes.  ABDOMEN: Soft, flat, nontender, nondistended. Has good bowel sounds. No hepatosplenomegaly appreciated.  EXTREMITIES: No evidence of any cyanosis, clubbing, or peripheral edema.  +2 pedal and radial pulses bilaterally.  NEUROLOGIC: The patient is alert, awake, and oriented x3 with no focal motor or sensory deficits appreciated bilaterally.  SKIN: Moist and warm with no rashes appreciated.  Psych: Not anxious, depressed LN: No inguinal LN enlargement    Antibiotics   Anti-infectives    Start     Dose/Rate Route Frequency Ordered Stop   08/17/17 1515  cefTRIAXone (ROCEPHIN) 1 g in dextrose 5 % 50 mL IVPB - Premix  Status:  Discontinued     1 g 100 mL/hr over 30 Minutes Intravenous  Once 08/17/17 1505 08/17/17 1510   08/17/17 1515  azithromycin (ZITHROMAX) tablet 500 mg  Status:  Discontinued     500 mg Oral  Once 08/17/17 1505 08/17/17 1510      Medications   Scheduled Meds: . enoxaparin (LOVENOX) injection  40 mg Subcutaneous Q24H  . pneumococcal 23 valent vaccine  0.5 mL Intramuscular Tomorrow-1000   Continuous Infusions: PRN Meds:.acetaminophen **OR** acetaminophen (TYLENOL) oral liquid 160 mg/5 mL **OR** acetaminophen   Data Review:   Micro Results No results found for this or any previous visit (from the past 240 hour(s)).  Radiology Reports Ct Head Wo Contrast  Result Date: 08/17/2017 CLINICAL DATA:  Dizziness EXAM: CT HEAD WITHOUT CONTRAST  TECHNIQUE: Contiguous axial images were obtained from the base of the skull through the vertex without intravenous contrast. COMPARISON:  None. FINDINGS: Brain: No evidence of acute infarction, hemorrhage, hydrocephalus, extra-axial collection or mass lesion/mass effect. Vascular: No hyperdense vessel or unexpected calcification. Skull: Normal. Negative for fracture or focal lesion. Sinuses/Orbits: No acute finding. Other: None. IMPRESSION: No acute abnormality noted. Electronically Signed   By: Alcide Clever M.D.   On: 08/17/2017 14:53     CBC  Recent Labs Lab 08/17/17 1316  WBC 7.2  HGB 11.9*  HCT 34.4*  PLT 304  MCV 84.9  MCH 29.2  MCHC 34.4  RDW 14.6*    Chemistries   Recent Labs Lab 08/17/17 1316  NA 138  K 3.0*  CL 100*  CO2 25  GLUCOSE 158*  BUN 12  CREATININE 1.27*  CALCIUM 9.7   ------------------------------------------------------------------------------------------------------------------ estimated creatinine clearance is 43.4 mL/min (A) (by C-G formula based on SCr of 1.27 mg/dL (H)). ------------------------------------------------------------------------------------------------------------------  Recent Labs  08/18/17 0332  HGBA1C 5.3   ------------------------------------------------------------------------------------------------------------------  Recent Labs  08/18/17 0332  CHOL 173  HDL 77  LDLCALC 87  TRIG 43  CHOLHDL 2.2   ------------------------------------------------------------------------------------------------------------------ No results for input(s): TSH, T4TOTAL, T3FREE, THYROIDAB in the last 72 hours.  Invalid input(s): FREET3 ------------------------------------------------------------------------------------------------------------------ No results for input(s): VITAMINB12, FOLATE, FERRITIN, TIBC, IRON, RETICCTPCT in the last 72 hours.  Coagulation profile No results for input(s): INR, PROTIME in the last 168 hours.  No  results for input(s): DDIMER in the last 72 hours.  Cardiac Enzymes  Recent Labs Lab 08/17/17 1316  TROPONINI <0.03   ------------------------------------------------------------------------------------------------------------------ Invalid input(s): POCBNP    Assessment & Plan  Patient is 71 year old presented with ataxia *Ataxia - suspected related to recent initiation of HCTZ, await MRI of the brain   *  HTN (hypertension) - blood pressure medication on hold  May be causing his symptoms  *Severe caloric protein malnutrition seen by nutritiontionist   * Dizziness -  workup as above, when necessary meclizine  *hypokalemia recheck  K+     Code Status Orders        Start     Ordered   08/17/17 2205  Full code  Continuous     08/17/17 2204    Code Status History    Date Active Date Inactive Code Status Order ID Comments User Context   This patient has a current code status but no historical code status.              DVT Prophylaxis  Lovenox    Lab Results  Component Value Date   PLT 304 08/17/2017     Time Spent in minutes  Greater than 50% of time spent in care coordination and counseling patient regarding the condition and plan of care.   Auburn Bilberry M.D on 08/18/2017 at 1:55 PM  Between 7am to 6pm - Pager - [phone]  After 6pm go to [email or web address] - password [email or web address] [facility]  [facility]   Office  [phone]
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
## 2017-08-18 NOTE — Consult Note (Signed)
Referring Physician: Allena Katz    Chief Complaint: Vertigo, ataxia  HPI: Earl Jenkins. is an 71 y.o. male who reports going to church on Saturday at baseline.  When he returned from church was dizzy.  Laid down for a nap and on awakening was noted to have difficulty with gait as well.  Patient has had a recent change in his antihypertensives and felt it was related to this but with no improvement presented for evaluation.  Patient also reports some fullness in his right ear that developed on yesterday.  Initial NIHSS of 0.   Date last known well: Date: 08/16/2017 Time last known well: Time: 09:00 tPA Given: No: Outside treatment window  Past Medical History:  Diagnosis Date  . Anemia   . Hypertension     Past Surgical History:  Procedure Laterality Date  . HEMORRHOID SURGERY      Family History  Problem Relation Age of Onset  . Hypertension Other    Social History:  reports that he has never smoked. He has never used smokeless tobacco. He reports that he does not drink alcohol or use drugs.  Allergies: No Known Allergies  Medications:  I have reviewed the patient's current medications. Prior to Admission:  Prescriptions Prior to Admission  Medication Sig Dispense Refill Last Dose  . benazepril-hydrochlorthiazide (LOTENSIN HCT) 10-12.5 MG tablet Take 1 tablet by mouth daily.   08/13/2017  . vitamin B-12 (CYANOCOBALAMIN) 1000 MCG tablet Take 1,000 mcg by mouth daily.   UNKNOWN at UNKNOWN   Scheduled: . [START ON 08/19/2017] benazepril  5 mg Oral Daily  . enoxaparin (LOVENOX) injection  40 mg Subcutaneous Q24H  . feeding supplement (ENSURE ENLIVE)  237 mL Oral TID BM  . [START ON 08/19/2017] multivitamin with minerals  1 tablet Oral Daily  . 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 ENT ROS: right ear fullness, vertigo Allergy and Immunology ROS: negative for - hives or itchy/watery eyes Hematological and Lymphatic ROS: negative for - bleeding problems, bruising or swollen lymph nodes Endocrine ROS: negative for - galactorrhea, hair pattern changes, polydipsia/polyuria or temperature intolerance Respiratory ROS: negative for - cough, hemoptysis, shortness of breath or wheezing Cardiovascular ROS: negative for - chest pain, dyspnea on exertion, edema or irregular heartbeat Gastrointestinal ROS: negative for - abdominal pain, diarrhea, hematemesis, nausea/vomiting or stool incontinence Genito-Urinary ROS: negative for - dysuria, hematuria, incontinence or urinary frequency/urgency Musculoskeletal ROS: negative for - joint swelling or muscular weakness Neurological ROS: as noted in HPI Dermatological ROS: negative for rash and skin lesion changes  Physical Examination: Blood pressure (!) 162/77, pulse 63, temperature 98.3 F (36.8 C), temperature source Oral, resp. rate 20, height  (1.88 m), weight 58.4 kg (128 lb 11.2 oz), SpO2 100 %.  ZOX:WRUEAVWUJ HEENT-  Normocephalic, no lesions, without obvious abnormality.  Normal external eye and conjunctiva.  Normal TM's bilaterally.  Normal auditory canals and external ears. Normal external nose, mucus membranes and septum.  Normal pharynx. Cardiovascular- S1, S2 normal, pulses palpable throughout   Lungs- chest clear, no wheezing, rales, normal symmetric air entry Abdomen- soft, non-tender; bowel sounds normal; no masses,  no organomegaly Extremities- no edema Lymph-no adenopathy palpable Musculoskeletal-no joint tenderness, deformity or swelling Skin-warm and dry,  no hyperpigmentation, vitiligo, or suspicious lesions  Neurological Examination   Mental Status: Alert, oriented, thought content appropriate.  Speech  fluent without evidence of aphasia.  Able to follow 3 step commands without difficulty. Cranial Nerves: II: Discs flat bilaterally; Visual fields grossly normal in right eye, blind left eye, right pupil reactive to light  III,IV, VI: ptosis not present, extra-ocular motions int act bilaterally with nystagmus on left lateral gaze V,VII: smile symmetric, facial light touch sensation normal bilaterally VIII: hearing normal bilaterally IX,X: gag reflex present XI: bilateral shoulder shrug XII: midline tongue extension Motor: Right : Upper extremity   5/5    Left:     Upper extremity   5/5  Lower extremity   5/5     Lower extremity   5/5 Tone and bulk:normal tone throughout; no atrophy noted Sensory: Pinprick and light touch intact throughout, bilaterally Deep Tendon Reflexes: 2+ in the upper extremities and absent in the lower extremities Plantars: Right: mute   Left: mute Cerebellar: Normal finger-to-nose and normal heel-to-shin testing bilaterally Gait: not tested due to safety concerns    Laboratory Studies:  Basic Metabolic Panel:  Recent Labs Lab 08/17/17 1316 08/18/17 1425  NA 138  --   K 3.0* 3.8  CL 100*  --   CO2 25  --   GLUCOSE 158*  --   BUN 12  --   CREATININE 1.27*  --   CALCIUM 9.7  --     Liver Function Tests: No results for input(s): AST, ALT, ALKPHOS, BILITOT, PROT, ALBUMIN in the last 168 hours. No results for input(s): LIPASE, AMYLASE in the last 168 hours. No results for input(s): AMMONIA in the last 168 hours.  CBC:  Recent Labs Lab 08/17/17 1316  WBC 7.2  HGB 11.9*  HCT 34.4*  MCV 84.9  PLT 304    Cardiac Enzymes:  Recent Labs Lab 08/17/17 1316  TROPONINI <0.03    BNP: Invalid input(s): POCBNP  CBG: No results for input(s): GLUCAP in the last 168 hours.  Microbiology: No results found for this or any previous visit.  Coagulation Studies: No results for input(s): LABPROT, INR in the last 72 hours.  Urinalysis:  Recent  Labs Lab 08/17/17 1316  COLORURINE STRAW*  LABSPEC 1.009  PHURINE 7.0  GLUCOSEU NEGATIVE  HGBUR SMALL*  BILIRUBINUR NEGATIVE  KETONESUR 5*  PROTEINUR NEGATIVE  NITRITE NEGATIVE  LEUKOCYTESUR NEGATIVE    Lipid Panel:    Component Value Date/Time   CHOL 173 08/18/2017 0332   TRIG 43 08/18/2017 0332   HDL 77 08/18/2017 0332   CHOLHDL 2.2 08/18/2017 0332   VLDL 9 08/18/2017 0332   LDLCALC 87 08/18/2017 0332    HgbA1C:  Lab Results  Component Value Date   HGBA1C 5.3 08/18/2017    Urine Drug Screen:  No results found for: LABOPIA, COCAINSCRNUR, LABBENZ, AMPHETMU, THCU, LABBARB  Alcohol Level: No results for input(s): ETH in the last 168 hours.  Other results: EKG: sinus rhythm at 58 bpm.  Imaging: Ct Head Wo Contrast  Result Date: 08/17/2017 CLINICAL DATA:  Dizziness EXAM: CT HEAD WITHOUT CONTRAST TECHNIQUE: Contiguous axial images were obtained from the base of the skull through the vertex without intravenous contrast. COMPARISON:  None. FINDINGS: Brain: No evidence of acute infarction, hemorrhage, hydrocephalus, extra-axial collection or mass lesion/mass effect. Vascular: No hyperdense vessel or unexpected calcification. Skull: Normal. Negative for fracture or focal lesion. Sinuses/Orbits: No acute finding. Other: None. IMPRESSION: No acute abnormality noted. Electronically Signed   By: Loraine Leriche  Lukens M.D.   On: 08/17/2017 14:53    Assessment: 71 y.o. male presenting with vertigo and ataxia.  Although there are some features the suggest a peripheral cause, can not rule out a more central cause.  Head CT reviewed and shows no acute changes.  Further work up recommended.   Echocardiogram shows no cardiac source of emboli with EF of 55-60%.  A1c 5.3.  LDL 87.    Stroke Risk Factors - hypertension  Plan: 1. HgbA1c, fasting lipid panel 2. MRI, MRA  of the brain without contrast 3. PT consult, OT consult, Speech consult 4. ASA  daily 5. Meclizine  q8 hours prn 6.  Telemetry monitoring 7. Frequent neuro checks   Thana Farr, MD Neurology [phone] 08/18/2017, 10:14 PM

## 2017-08-19 MED ORDER — BENAZEPRIL HCL 5 MG PO TABS: 5.0000 mg | ORAL_TABLET | Freq: Every day | ORAL | 0 refills | Status: AC

## 2017-08-19 MED ORDER — ADULT MULTIVITAMIN W/MINERALS CH: 1.0000 | ORAL_TABLET | Freq: Every day | ORAL | 0 refills | Status: AC

## 2017-08-19 NOTE — Progress Notes (Signed)
Pt for discharge home. A/o. No resp distress.  No c/o further  Dizziness. Sl d/cd. Instructions discussed with pt. presc  Given and discussed and home meds discussed. / diet , activity and f/u  Discussed.  Verbalizes understanding of all plans. Ready for discharge .

## 2017-08-19 NOTE — Discharge Instructions (Signed)
[facility] - [place] at [facility]  DIET:  Cardiac diet  DISCHARGE CONDITION:  Stable  ACTIVITY:  Activity as tolerated  OXYGEN:  Home Oxygen: No.   Oxygen Delivery: room air  DISCHARGE LOCATION:  home    ADDITIONAL DISCHARGE INSTRUCTION: please keep log to take to primary md   If you experience worsening of your admission symptoms, develop shortness of breath, life threatening emergency, suicidal or homicidal thoughts you must seek medical attention immediately by calling 911 or calling your MD immediately  if symptoms less severe.  You Must read complete instructions/literature along with all the possible adverse reactions/side effects for all the Medicines you take and that have been prescribed to you. Take any new Medicines after you have completely understood and accpet all the possible adverse reactions/side effects.   Please note  You were cared for by a hospitalist during your hospital stay. If you have any questions about your discharge medications or the care you received while you were in the hospital after you are discharged, you can call the unit and asked to speak with the hospitalist on call if the hospitalist that took care of you is not available. Once you are discharged, your primary care physician will handle any further medical issues. Please note that NO REFILLS for any discharge medications will be authorized once you are discharged, as it is imperative that you return to your primary care physician (or establish a relationship with a primary care physician if you do not have one) for your aftercare needs so that they can reassess your need for medications and monitor your lab values.

## 2017-08-19 NOTE — Discharge Summary (Signed)
[facility] -  at [facility] 71 y.o., DOB 06/23/1946, MRN [id number]. Admission date: 08/17/2017 Discharge Date 08/19/2017 Primary MD Dorothey Baseman, MD Admitting Physician [name], MD  Admission Diagnosis  Dehydration [E86.0] Ataxia [R27.0] Vertigo [R42]  Discharge Diagnosis   Principal Problem:   Ataxia and dizziness   HTN (hypertension)  History anemia Severe calorie protein malnutrition      Hospital Course patient is a 71 year old male presented to the hospital with the complaint of feeling dizzy. And difficulty with his gait. He stated that he was started on blood pressure medication recently. Since starting that he started feeling this way. Patient was seen in the emergency room and was admitted for further evaluation. CT scan of the head was negative. He underwent MRI and MRA of the brain which showed no stroke. Echocardiogram of the heart shows no significant pathology. Patient was seen by occupational therapy and will be seen by PT. His blood pressure medications have been adjusted and recommended that he keep a log for his primary care provider.            Consults  neurology  Significant Tests:  See full reports for all details     Ct Head Wo Contrast  Result Date: 08/17/2017 CLINICAL DATA:  Dizziness EXAM: CT HEAD WITHOUT CONTRAST TECHNIQUE: Contiguous axial images were obtained from the base of the skull through the vertex without intravenous contrast. COMPARISON:  None. FINDINGS: Brain: No evidence of acute infarction, hemorrhage, hydrocephalus, extra-axial collection or mass lesion/mass effect. Vascular: No hyperdense vessel or unexpected calcification. Skull: Normal. Negative for fracture or focal lesion. Sinuses/Orbits: No acute finding. Other: None. IMPRESSION: No acute abnormality noted. Electronically Signed   By: Alcide Clever M.D.   On: 08/17/2017 14:53   Mr Maxine Glenn Neck W Wo Contrast  Result Date:  08/18/2017 CLINICAL DATA:  Ataxia and dizziness EXAM: MR HEAD WITHOUT CONTRAST MR CIRCLE OF WILLIS WITHOUT CONTRAST MRA OF THE NECK WITHOUT AND WITH CONTRAST TECHNIQUE: Multiplanar, multiecho pulse sequences of the brain, circle of willis and surrounding structures were obtained without intravenous contrast. Angiographic images of the neck were obtained using MRA technique without and with intravenous contrast. CONTRAST:  10mL MULTIHANCE GADOBENATE DIMEGLUMINE 529 MG/ML IV SOLN COMPARISON:  Head CT 08/17/2017 FINDINGS: MRI HEAD FINDINGS Brain: The midline structures are normal. No focal diffusion restriction to indicate acute infarct. No intraparenchymal hemorrhage. The brain parenchymal signal is normal. No mass lesion. No chronic microhemorrhage or cerebral amyloid angiopathy. No hydrocephalus, age advanced atrophy or lobar predominant volume loss. No dural abnormality or extra-axial collection. Skull and upper cervical spine: The visualized skull base, calvarium, upper cervical spine and extracranial soft tissues are normal. Sinuses/Orbits: No fluid levels or advanced mucosal thickening. No mastoid effusion. Normal orbits. MRA HEAD FINDINGS Intracranial internal carotid arteries: Normal. Anterior cerebral arteries: Normal. Middle cerebral arteries: Normal. Posterior communicating arteries: Absent bilaterally. Posterior cerebral arteries: Normal. Basilar artery: Normal. Vertebral arteries:  Right dominant. Normal. Superior cerebellar arteries: Normal. Anterior inferior cerebellar arteries: Normal. Posterior inferior cerebellar arteries: Normal. MRA NECK FINDINGS Normal variant aortic branching pattern with independent origin of the left vertebral artery. Vertebral system is right dominant and is widely patent. The common carotid and internal carotid arteries are normal. No stenosis. Limited anatomic imaging of neck is normal. IMPRESSION: 1. Normal MRI of brain. 2. Normal MRA of the head and neck. Electronically  Signed   By: Deatra Robinson M.D.   On: 08/18/2017 22:27  Mr Brain Wo Contrast  Result Date: 08/18/2017 CLINICAL DATA:  Ataxia and dizziness EXAM: MR HEAD WITHOUT CONTRAST MR CIRCLE OF WILLIS WITHOUT CONTRAST MRA OF THE NECK WITHOUT AND WITH CONTRAST TECHNIQUE: Multiplanar, multiecho pulse sequences of the brain, circle of willis and surrounding structures were obtained without intravenous contrast. Angiographic images of the neck were obtained using MRA technique without and with intravenous contrast. CONTRAST:  10mL MULTIHANCE GADOBENATE DIMEGLUMINE 529 MG/ML IV SOLN COMPARISON:  Head CT 08/17/2017 FINDINGS: MRI HEAD FINDINGS Brain: The midline structures are normal. No focal diffusion restriction to indicate acute infarct. No intraparenchymal hemorrhage. The brain parenchymal signal is normal. No mass lesion. No chronic microhemorrhage or cerebral amyloid angiopathy. No hydrocephalus, age advanced atrophy or lobar predominant volume loss. No dural abnormality or extra-axial collection. Skull and upper cervical spine: The visualized skull base, calvarium, upper cervical spine and extracranial soft tissues are normal. Sinuses/Orbits: No fluid levels or advanced mucosal thickening. No mastoid effusion. Normal orbits. MRA HEAD FINDINGS Intracranial internal carotid arteries: Normal. Anterior cerebral arteries: Normal. Middle cerebral arteries: Normal. Posterior communicating arteries: Absent bilaterally. Posterior cerebral arteries: Normal. Basilar artery: Normal. Vertebral arteries:  Right dominant. Normal. Superior cerebellar arteries: Normal. Anterior inferior cerebellar arteries: Normal. Posterior inferior cerebellar arteries: Normal. MRA NECK FINDINGS Normal variant aortic branching pattern with independent origin of the left vertebral artery. Vertebral system is right dominant and is widely patent. The common carotid and internal carotid arteries are normal. No stenosis. Limited anatomic imaging of neck is  normal. IMPRESSION: 1. Normal MRI of brain. 2. Normal MRA of the head and neck. Electronically Signed   By: Deatra Robinson M.D.   On: 08/18/2017 22:27   Mr Maxine Glenn Head/brain ZO Cm  Result Date: 08/18/2017 CLINICAL DATA:  Ataxia and dizziness EXAM: MR HEAD WITHOUT CONTRAST MR CIRCLE OF WILLIS WITHOUT CONTRAST MRA OF THE NECK WITHOUT AND WITH CONTRAST TECHNIQUE: Multiplanar, multiecho pulse sequences of the brain, circle of willis and surrounding structures were obtained without intravenous contrast. Angiographic images of the neck were obtained using MRA technique without and with intravenous contrast. CONTRAST:  10mL MULTIHANCE GADOBENATE DIMEGLUMINE 529 MG/ML IV SOLN COMPARISON:  Head CT 08/17/2017 FINDINGS: MRI HEAD FINDINGS Brain: The midline structures are normal. No focal diffusion restriction to indicate acute infarct. No intraparenchymal hemorrhage. The brain parenchymal signal is normal. No mass lesion. No chronic microhemorrhage or cerebral amyloid angiopathy. No hydrocephalus, age advanced atrophy or lobar predominant volume loss. No dural abnormality or extra-axial collection. Skull and upper cervical spine: The visualized skull base, calvarium, upper cervical spine and extracranial soft tissues are normal. Sinuses/Orbits: No fluid levels or advanced mucosal thickening. No mastoid effusion. Normal orbits. MRA HEAD FINDINGS Intracranial internal carotid arteries: Normal. Anterior cerebral arteries: Normal. Middle cerebral arteries: Normal. Posterior communicating arteries: Absent bilaterally. Posterior cerebral arteries: Normal. Basilar artery: Normal. Vertebral arteries:  Right dominant. Normal. Superior cerebellar arteries: Normal. Anterior inferior cerebellar arteries: Normal. Posterior inferior cerebellar arteries: Normal. MRA NECK FINDINGS Normal variant aortic branching pattern with independent origin of the left vertebral artery. Vertebral system is right dominant and is widely patent. The common  carotid and internal carotid arteries are normal. No stenosis. Limited anatomic imaging of neck is normal. IMPRESSION: 1. Normal MRI of brain. 2. Normal MRA of the head and neck. Electronically Signed   By: Deatra Robinson M.D.   On: 08/18/2017 22:27       Today   Subjective:   Earl Jenkins  patient feels well denies any symptoms  Objective:  Blood pressure (!) 149/70, pulse 72, temperature 98.9 F (37.2 C), temperature source Oral, resp. rate 20, height  (1.88 m), weight 128 lb 11.2 oz (58.4 kg), SpO2 100 %.  .  Intake/Output Summary (Last 24 hours) at 08/19/17 1102 Last data filed at 08/19/17 1000  Gross per 24 hour  Intake              840 ml  Output              350 ml  Net              490 ml    Exam VITAL SIGNS: Blood pressure (!) 149/70, pulse 72, temperature 98.9 F (37.2 C), temperature source Oral, resp. rate 20, height  (1.88 m), weight 128 lb 11.2 oz (58.4 kg), SpO2 100 %.  GENERAL:  71 y.o.-year-old patient lying in the bed with no acute distress.  EYES: Pupils equal, round, reactive to light and accommodation. No scleral icterus. Extraocular muscles intact.  HEENT: Head atraumatic, normocephalic. Oropharynx and nasopharynx clear.  NECK:  Supple, no jugular venous distention. No thyroid enlargement, no tenderness.  LUNGS: Normal breath sounds bilaterally, no wheezing, rales,rhonchi or crepitation. No use of accessory muscles of respiration.  CARDIOVASCULAR: S1, S2 normal. No murmurs, rubs, or gallops.  ABDOMEN: Soft, nontender, nondistended. Bowel sounds present. No organomegaly or mass.  EXTREMITIES: No pedal edema, cyanosis, or clubbing.  NEUROLOGIC: Cranial nerves II through XII are intact. Muscle strength 5/5 in all extremities. Sensation intact. Gait not checked.  PSYCHIATRIC: The patient is alert and oriented x 3.  SKIN: No obvious rash, lesion, or ulcer.   Data Review     CBC w Diff: Lab Results  Component Value Date   WBC 7.2 08/17/2017    HGB 11.9 (L) 08/17/2017   HCT 34.4 (L) 08/17/2017   PLT 304 08/17/2017   CMP: Lab Results  Component Value Date   NA 138 08/17/2017   K 3.8 08/18/2017   CL 100 (L) 08/17/2017   CO2 25 08/17/2017   BUN 12 08/17/2017   CREATININE 1.27 (H) 08/17/2017  .  Micro Results No results found for this or any previous visit (from the past 240 hour(s)).      Code Status Orders        Start     Ordered   08/17/17 2205  Full code  Continuous     08/17/17 2204    Code Status History    Date Active Date Inactive Code Status Order ID Comments User Context   This patient has a current code status but no historical code status.          Follow-up Information    Dorothey Baseman, MD Follow up in 2 day(s).   Specialty:  Family Medicine Why:  for blood pressure recheck Contact information: [address] [place] [address] [phone]           Discharge Medications   Allergies as of 08/19/2017   No Known Allergies     Medication List    STOP taking these medications   benazepril-hydrochlorthiazide 10-12.5 MG tablet Commonly known as:  LOTENSIN HCT   hydrochlorothiazide 25 MG tablet Commonly known as:  HYDRODIURIL   lisinopril-hydrochlorothiazide 10-12.5 MG tablet Commonly known as:  PRINZIDE,ZESTORETIC     TAKE these medications   benazepril 5 MG tablet Commonly known as:  LOTENSIN Take 1 tablet (5 mg total) by mouth daily.   multivitamin with minerals Tabs tablet Take  1 tablet by mouth daily.   vitamin B-12 1000 MCG tablet Commonly known as:  CYANOCOBALAMIN Take 1,000 mcg by mouth daily.            Discharge Care Instructions        Start     Ordered   08/19/17 0000  benazepril (LOTENSIN) 5 MG tablet  Daily     08/19/17 0930   08/19/17 0000  Multiple Vitamin (MULTIVITAMIN WITH MINERALS) TABS tablet  Daily     08/19/17 0930         Total Time in preparing paper work, data evaluation and todays exam - 35 minutes  Auburn Bilberry M.D  on 08/19/2017 at 11:02 AM  [facility]   Office  [phone]

## 2017-08-20 LAB — CALCITRIOL (1,25 DI-OH VIT D): VIT D 1 25 DIHYDROXY: 38.5 pg/mL (ref 19.9–79.3)

## 2018-07-12 IMAGING — MR MR MRA HEAD W/O CM
11 series · 45 of 48 positions shown · IV contrast (multihance)
Comparison: Head CT 08/17/2017

CLINICAL DATA: Ataxia and dizziness

EXAM:
MR HEAD WITHOUT CONTRAST
MR CIRCLE OF WILLIS WITHOUT CONTRAST
MRA OF THE NECK WITHOUT AND WITH CONTRAST
TECHNIQUE: Multiplanar, multiecho pulse sequences of the brain, circle of
willis and surrounding structures were obtained without intravenous
contrast. Angiographic images of the neck were obtained using MRA
technique without and with intravenous contrast.
CONTRAST:  10mL MULTIHANCE GADOBENATE DIMEGLUMINE 529 MG/ML IV SOLN

[Series 2: T1 · sagittal · 5.0mm · 0.45mm/px · 3 of 27 slices shown (1 of 2)]
[im 1/27]
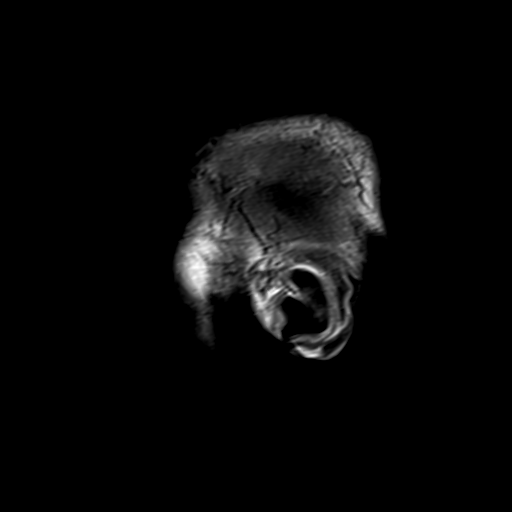
[im 14/27]
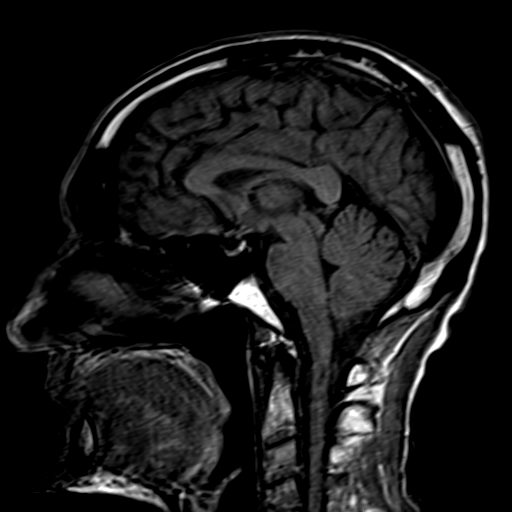
[im 27/27]
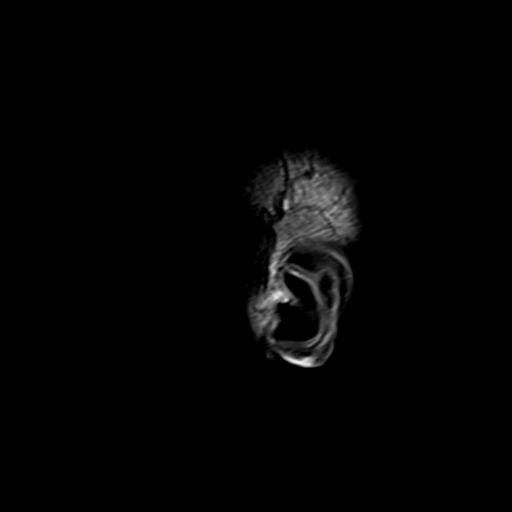

[Series 4: DWI · axial · 3.0mm · 1.80mm/px · z∈[-84,+78]mm · 6 of 55 slices shown (1 of 2)]
[im 1/55]
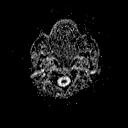
[im 11/55]
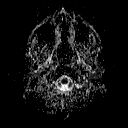
[im 22/55]
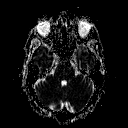
[im 33/55]
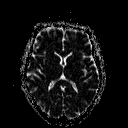
[im 44/55]
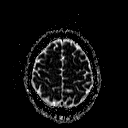
[im 55/55]
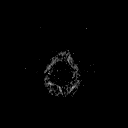

[Series 6: DWI · coronal · 3.0mm · 1.80mm/px · 4 of 49 slices shown (2 of 2)]
[im 1/49]
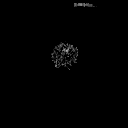
[im 17/49]
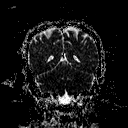
[im 33/49]
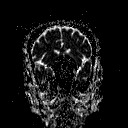
[im 49/49]
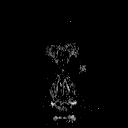

[Series 7: TOF · axial · non-contrast · 0.7mm · 0.37mm/px · z∈[-64,+33]mm · 9 of 140 slices shown]
[im 1/140]
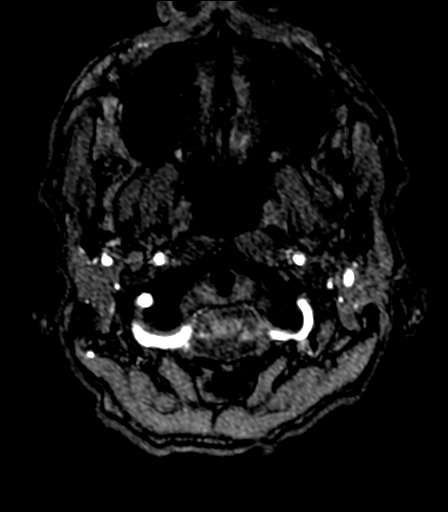
[im 13/140]
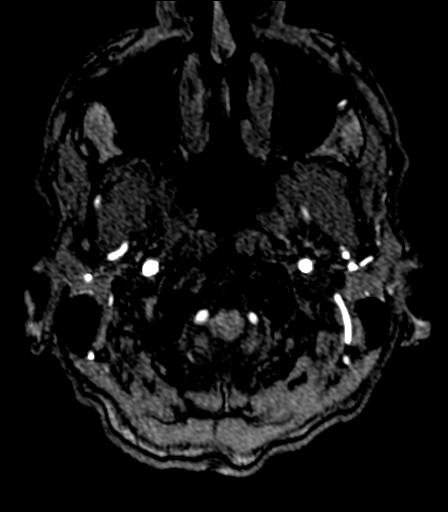
[im 26/140]
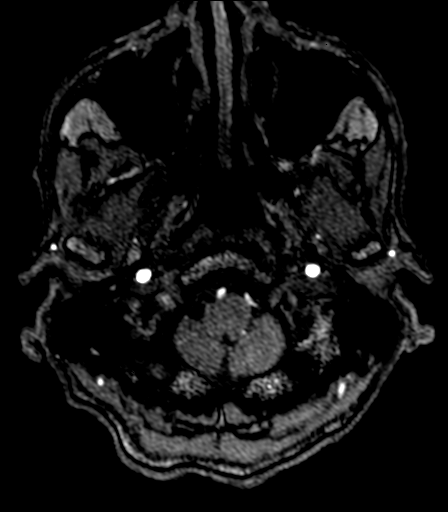
[im 38/140]
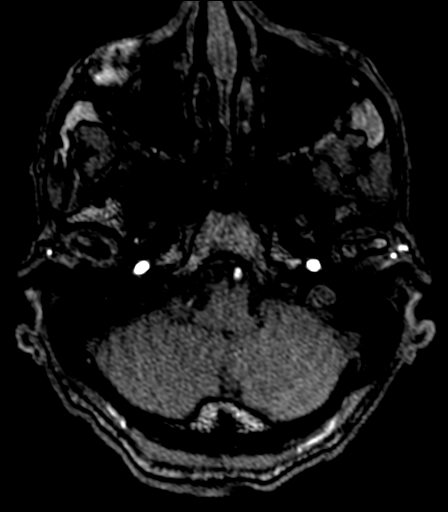
[im 64/140]
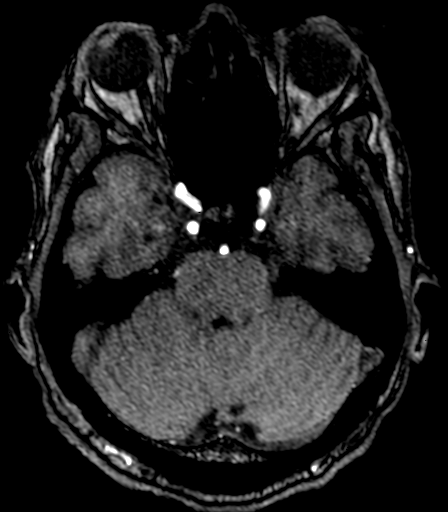
[im 76/140]
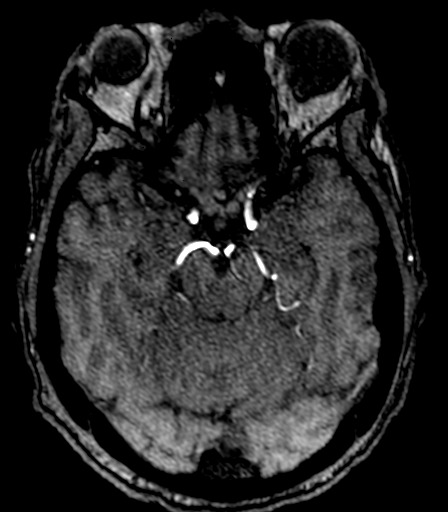
[im 102/140]
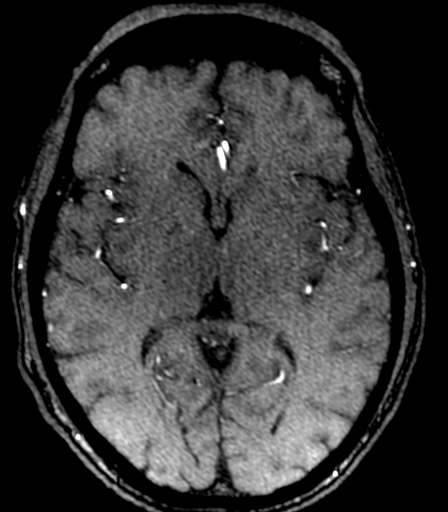
[im 114/140]
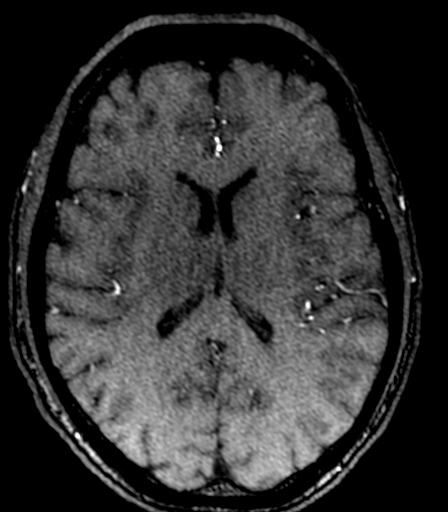
[im 140/140]
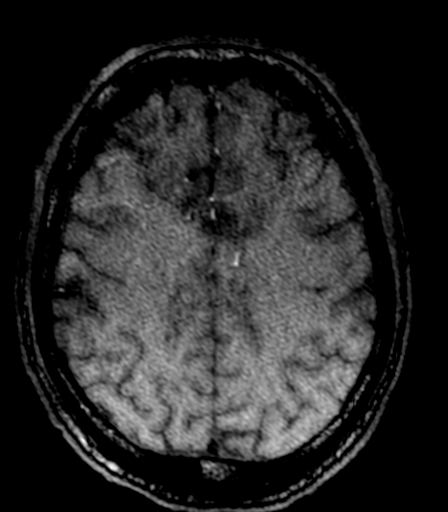

[Series 11: T2 · axial · 5.0mm · 0.90mm/px · z∈[-87,+82]mm · 2 of 27 slices shown (1 of 3)]
[im 1/27]
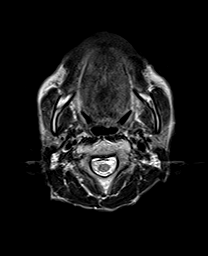
[im 27/27]
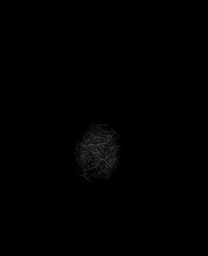

[Series 13: FLAIR · axial · 5.0mm · 0.45mm/px · z∈[-87,+82]mm · 2 of 27 slices shown]
[im 1/27]
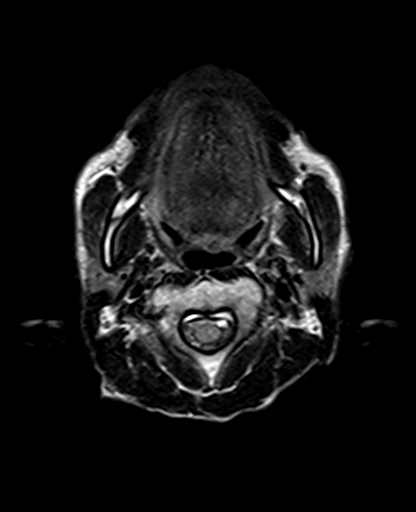
[im 27/27]
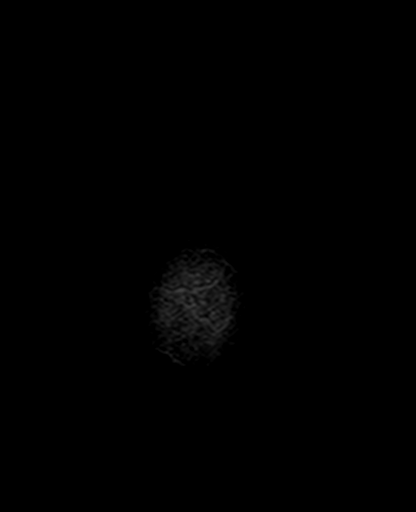

[Series 14: T2 · axial · 5.0mm · 0.45mm/px · z∈[-87,+82]mm · 2 of 27 slices shown (2 of 3)]
[im 1/27]
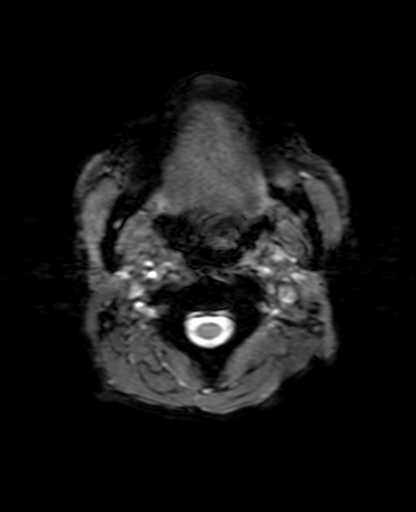
[im 27/27]
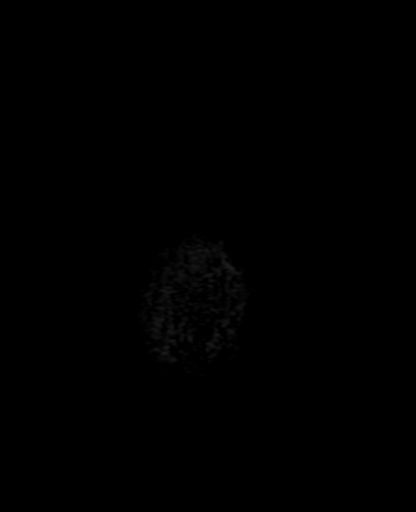

[Series 15: T1 · axial · 3.0mm · 1.00mm/px · z∈[-90,+86]mm · 5 of 60 slices shown (2 of 2)]
[im 1/60]
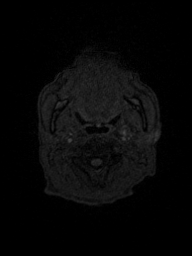
[im 15/60]
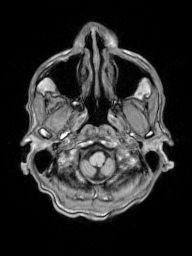
[im 30/60]
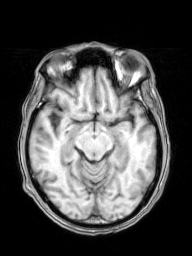
[im 45/60]
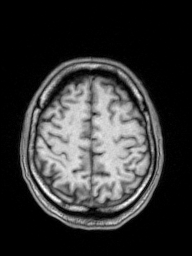
[im 60/60]
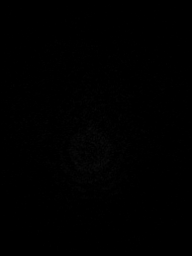

[Series 16: T2 · coronal · 5.0mm · 0.86mm/px · 3 of 31 slices shown (3 of 3)]
[im 1/31]
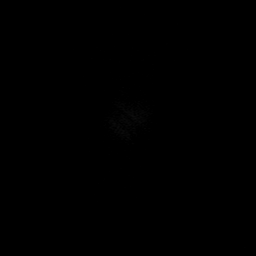
[im 16/31]
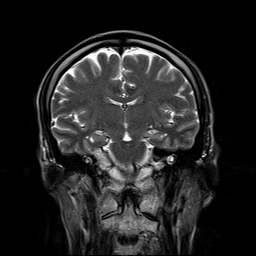
[im 31/31]
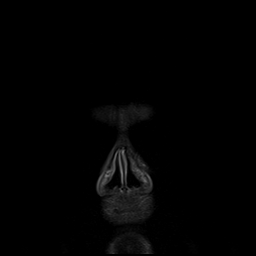

[Series 100: ax (id) · axial · 3.0mm · 1.80mm/px · z∈[-84,+78]mm · 5 of 55 slices shown]
[im 1/55]
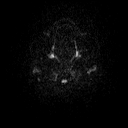
[im 14/55]
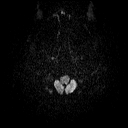
[im 28/55]
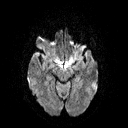
[im 41/55]
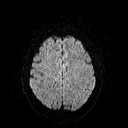
[im 55/55]
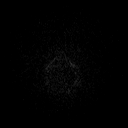

[Series 101: cor (id) · coronal · 3.0mm · 1.80mm/px · 4 of 49 slices shown]
[im 1/49]
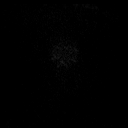
[im 17/49]
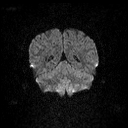
[im 33/49]
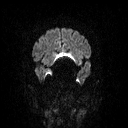
[im 49/49]
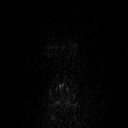

[45 of 48 positions shown; findings below may reference images not displayed]

FINDINGS: MRI HEAD FINDINGS

Brain: The midline structures are normal. No focal diffusion
restriction to indicate acute infarct. No intraparenchymal
hemorrhage. The brain parenchymal signal is normal. No mass lesion.
No chronic microhemorrhage or cerebral amyloid angiopathy. No
hydrocephalus, age advanced atrophy or lobar predominant volume
loss. No dural abnormality or extra-axial collection.

Skull and upper cervical spine: The visualized skull base,
calvarium, upper cervical spine and extracranial soft tissues are
normal.

Sinuses/Orbits: No fluid levels or advanced mucosal thickening. No
mastoid effusion. Normal orbits.

MRA HEAD FINDINGS

Intracranial internal carotid arteries: Normal.

Anterior cerebral arteries: Normal.

Middle cerebral arteries: Normal.

Posterior communicating arteries: Absent bilaterally.

Posterior cerebral arteries: Normal.

Basilar artery: Normal.

Vertebral arteries:  Right dominant. Normal.

Superior cerebellar arteries: Normal.

Anterior inferior cerebellar arteries: Normal.

Posterior inferior cerebellar arteries: Normal.

MRA NECK FINDINGS

Normal variant aortic branching pattern with independent origin of
the left vertebral artery. Vertebral system is right dominant and is
widely patent. The common carotid and internal carotid arteries are
normal. No stenosis.

Limited anatomic imaging of neck is normal.
IMPRESSION: 1. Normal MRI of brain.
2. Normal MRA of the head and neck.

## 2021-05-08 ENCOUNTER — Emergency Department
Admission: EM | Admit: 2021-05-08 | Discharge: 2021-05-08 | Disposition: A | Discharge status: Home / Self Care | Payer: Medicare Other | Attending: Emergency Medicine | Admitting: Emergency Medicine

## 2021-05-08 ENCOUNTER — Other Ambulatory Visit: Payer: Self-pay

## 2021-05-08 DIAGNOSIS — Z79899 Other long term (current) drug therapy: Secondary | ICD-10-CM | POA: Diagnosis not present

## 2021-05-08 DIAGNOSIS — U071 COVID-19: Secondary | ICD-10-CM | POA: Diagnosis not present

## 2021-05-08 DIAGNOSIS — I10 Essential (primary) hypertension: Secondary | ICD-10-CM | POA: Diagnosis not present

## 2021-05-08 DIAGNOSIS — R509 Fever, unspecified: Secondary | ICD-10-CM | POA: Diagnosis present

## 2021-05-08 LAB — RESP PANEL BY RT-PCR (FLU A&B, COVID) ARPGX2
Influenza A by PCR: NEGATIVE
Influenza B by PCR: NEGATIVE
SARS Coronavirus 2 by RT PCR: POSITIVE — AB

## 2021-05-08 LAB — BASIC METABOLIC PANEL
Anion gap: 8 (ref 5–15)
BUN: 20 mg/dL (ref 8–23)
CO2: 24 mmol/L (ref 22–32)
Calcium: 9.2 mg/dL (ref 8.9–10.3)
Chloride: 102 mmol/L (ref 98–111)
Creatinine, Ser: 1.39 mg/dL — ABNORMAL HIGH (ref 0.61–1.24)
GFR, Estimated: 53 mL/min — ABNORMAL LOW (ref >60)
Glucose, Bld: 110 mg/dL — ABNORMAL HIGH (ref 70–99)
Potassium: 3 mmol/L — ABNORMAL LOW (ref 3.5–5.1)
Sodium: 134 mmol/L — ABNORMAL LOW (ref 135–145)

## 2021-05-08 LAB — CBC
HCT: 29.7 % — ABNORMAL LOW (ref 39.0–52.0)
Hemoglobin: 9.9 g/dL — ABNORMAL LOW (ref 13.0–17.0)
MCH: 29.1 pg (ref 26.0–34.0)
MCHC: 33.3 g/dL (ref 30.0–36.0)
MCV: 87.4 fL (ref 80.0–100.0)
Platelets: 253 10*3/uL (ref 150–400)
RBC: 3.4 MIL/uL — ABNORMAL LOW (ref 4.22–5.81)
RDW: 13.5 % (ref 11.5–15.5)
WBC: 8.2 10*3/uL (ref 4.0–10.5)
nRBC: 0 % (ref 0.0–0.2)

## 2021-05-08 MED ORDER — ACETAMINOPHEN 325 MG PO TABS
650.0000 mg | ORAL_TABLET | Freq: Once | ORAL | Status: AC | PRN
  Administered 2021-05-08: 650 mg via ORAL
  Filled 2021-05-08: qty 2

## 2021-05-08 MED ORDER — POTASSIUM CHLORIDE CRYS ER 20 MEQ PO TBCR
40.0000 meq | EXTENDED_RELEASE_TABLET | Freq: Once | ORAL | Status: AC
  Administered 2021-05-08: 40 meq via ORAL
  Filled 2021-05-08: qty 2

## 2021-05-08 NOTE — ED Triage Notes (Signed)
Pt presents via POV c/o "chills". Reports was outside yesterday in heat and has been having intermittent chills. + fever. Denies pain. Denies cough.

## 2021-05-08 NOTE — ED Provider Notes (Signed)
[facility] Emergency Department Provider Note  ____________________________________________  Time seen: Approximately 8:29 PM  I have reviewed the triage vital signs and the nursing notes.   HISTORY  Chief Complaint Chills and Fever    HPI Earl Jenkins. is a 75 y.o. male who presents the emergency department for evaluation of "a chill."  Patient states that this afternoon he had a chill that lasted for short amount of time but he is currently not experiencing this symptom.  He denied any other complaints to include headache, nasal congestion, sore throat, cough, abdominal pain, nausea vomiting, diarrhea or constipation.  No urinary symptoms.  Patient denies any visual changes, neck pain or stiffness, chest pain, shortness of breath.  Patient has not taken his temperature at home.  No medications prior to arrival.  Patient states that he was around some people not wearing a mask at church and he is concerned that he may have "caught something."       Past Medical History:  Diagnosis Date   Anemia    Hypertension     Patient Active Problem List   Diagnosis Date Noted   Ataxia 08/17/2017   HTN (hypertension) 08/17/2017   Dizziness 08/17/2017    Past Surgical History:  Procedure Laterality Date   HEMORRHOID SURGERY      Prior to Admission medications   Medication Sig Start Date End Date Taking? Authorizing Provider  benazepril (LOTENSIN) 5 MG tablet Take 1 tablet (5 mg total) by mouth daily. 08/19/17   Auburn Bilberry, MD  Multiple Vitamin (MULTIVITAMIN WITH MINERALS) TABS tablet Take 1 tablet by mouth daily. 08/19/17   Auburn Bilberry, MD  vitamin B-12 (CYANOCOBALAMIN) 1000 MCG tablet Take 1,000 mcg by mouth daily.    [provider]    Allergies Patient has no known allergies.  Family History  Problem Relation Age of Onset   Hypertension Other     Social History Social History   Tobacco Use   Smoking status: Never    Smokeless tobacco: Never  Substance Use Topics   Alcohol use: No   Drug use: No     Review of Systems  Constitutional: No fever but endorses a chill this afternoon. Eyes: No visual changes. No discharge ENT: No upper respiratory complaints. Cardiovascular: no chest pain. Respiratory: no cough. No SOB. Gastrointestinal: No abdominal pain.  No nausea, no vomiting.  No diarrhea.  No constipation. Genitourinary: Negative for dysuria. No hematuria Musculoskeletal: Negative for musculoskeletal pain. Skin: Negative for rash, abrasions, lacerations, ecchymosis. Neurological: Negative for headaches, focal weakness or numbness.  10 System ROS otherwise negative.  ____________________________________________   PHYSICAL EXAM:  VITAL SIGNS: ED Triage Vitals  Enc Vitals Group     BP 05/08/21 1920 (!) 148/73     Pulse Rate 05/08/21 1920 (!) 122     Resp 05/08/21 1920 16     Temp 05/08/21 1920 (!) 101 F (38.3 C)     Temp Source 05/08/21 1920 Oral     SpO2 05/08/21 1920 98 %     Weight --      Height --      Head Circumference --      Peak Flow --      Pain Score 05/08/21 1921 0     Pain Loc --      Pain Edu? --      Excl. in GC? --      Constitutional: Alert and oriented. Well appearing and in no acute distress. Eyes:  Conjunctivae are normal. PERRL. EOMI. Head: Atraumatic. ENT:      Ears:       Nose: No congestion/rhinnorhea.      Mouth/Throat: Mucous membranes are moist.  Neck: No stridor.  Neck is supple full range of motion Hematological/Lymphatic/Immunilogical: No cervical lymphadenopathy. Cardiovascular: Normal rate, regular rhythm. Normal S1 and S2.  Good peripheral circulation. Respiratory: Normal respiratory effort without tachypnea or retractions. Lungs CTAB. Good air entry to the bases with no decreased or absent breath sounds. Gastrointestinal: Bowel sounds 4 quadrants. Soft and nontender to palpation. No guarding or rigidity. No palpable masses. No distention.  No CVA tenderness Musculoskeletal: Full range of motion to all extremities. No gross deformities appreciated. Neurologic:  Normal speech and language. No gross focal neurologic deficits are appreciated.  Skin:  Skin is warm, dry and intact. No rash noted. Psychiatric: Mood and affect are normal. Speech and behavior are normal. Patient exhibits appropriate insight and judgement.   ____________________________________________   LABS (all labs ordered are listed, but only abnormal results are displayed)  Labs Reviewed  RESP PANEL BY RT-PCR (FLU A&B, COVID) ARPGX2 - Abnormal; Notable for the following components:      Result Value   SARS Coronavirus 2 by RT PCR POSITIVE (*)    All other components within normal limits  CBC - Abnormal; Notable for the following components:   RBC 3.40 (*)    Hemoglobin 9.9 (*)    HCT 29.7 (*)    All other components within normal limits  BASIC METABOLIC PANEL - Abnormal; Notable for the following components:   Sodium 134 (*)    Potassium 3.0 (*)    Glucose, Bld 110 (*)    Creatinine, Ser 1.39 (*)    GFR, Estimated 53 (*)    All other components within normal limits   ____________________________________________  EKG   ____________________________________________  RADIOLOGY   No results found.  ____________________________________________    PROCEDURES  Procedure(s) performed:    Procedures    Medications  acetaminophen (TYLENOL) tablet 650 mg (650 mg Oral Given 05/08/21 1927)  potassium chloride SA (KLOR-CON) CR tablet 40 mEq (40 mEq Oral Given 05/08/21 2322)     ____________________________________________   INITIAL IMPRESSION / ASSESSMENT AND PLAN / ED COURSE  Pertinent labs & imaging results that were available during my care of the patient were reviewed by me and considered in my medical decision making (see chart for details).  Review of the  [place]RS was performed in accordance of the [place] prior to dispensing any  controlled drugs.           Patient's diagnosis is consistent with COVID-19.  Patient presented to the emergency department complaining of a "chill" this afternoon.  He states that he had been around somebody at church without a mask was concerned that he may have "caught something."  Patient arrived with fever and tachycardia but not taken any antipyretics today.  Tylenol was administered and patient had improvement of his temperature to 99.5 with a heart rate of 75.  Patient is in no distress.  Physical exam was reassuring with no acute findings.  Patient's lab revealed that he was hypokalemic and did have a positive COVID result.  Patient states that he has long known that he has low potassium and is supposed to take potassium supplements every day but does not take same.  Patient is given potassium here in the emergency department encouraged to take potassium at home.  At this time exam was reassuring, vital  signs had improved and there is no indication for further work-up as we have diagnosed the patient with COVID-19.  Concerning signs and symptoms are discussed with the patient at length.  He verbalizes understanding of same.  Tylenol and Motrin at home for any fever or chill.  Patient develops other symptoms, become short of breath, or has new or worsening symptoms he should return to the emergency department for evaluation.  Patient is not prescribed potassium as he already has potassium supplements at home but he is encouraged to take same.  Follow-up primary care as needed.. Patient is given ED precautions to return to the ED for any worsening or new symptoms.     ____________________________________________  FINAL CLINICAL IMPRESSION(S) / ED DIAGNOSES  Final diagnoses:  COVID-19      NEW MEDICATIONS STARTED DURING THIS VISIT:  ED Discharge Orders     None           This chart was dictated using voice recognition software/Dragon. Despite best efforts to proofread, errors  can occur which can change the meaning. Any change was purely unintentional.    Racheal Patches, PA-C 05/09/21 0118    Chesley Noon, MD 05/10/21 [phone]

## 2022-02-08 LAB — COLOGUARD
